# Patient Record
Sex: Male | Born: 1940 | Race: White | Hispanic: No | Marital: Married | Smoking: Former smoker
Health system: Southern US, Community
[De-identification: ages and names within clinical notes are randomized; demographics above are authoritative.]

## PROBLEM LIST (undated history)

## (undated) DIAGNOSIS — E785 Hyperlipidemia, unspecified: Secondary | ICD-10-CM

## (undated) DIAGNOSIS — E669 Obesity, unspecified: Secondary | ICD-10-CM

## (undated) DIAGNOSIS — I259 Chronic ischemic heart disease, unspecified: Secondary | ICD-10-CM

## (undated) DIAGNOSIS — I2510 Atherosclerotic heart disease of native coronary artery without angina pectoris: Secondary | ICD-10-CM

## (undated) DIAGNOSIS — I10 Essential (primary) hypertension: Secondary | ICD-10-CM

## (undated) DIAGNOSIS — I5189 Other ill-defined heart diseases: Secondary | ICD-10-CM

## (undated) HISTORY — DX: Essential (primary) hypertension: I10

## (undated) HISTORY — DX: Atherosclerotic heart disease of native coronary artery without angina pectoris: I25.10

## (undated) HISTORY — DX: Chronic ischemic heart disease, unspecified: I25.9

## (undated) HISTORY — DX: Other ill-defined heart diseases: I51.89

## (undated) HISTORY — DX: Hyperlipidemia, unspecified: E78.5

## (undated) HISTORY — PX: CORONARY ARTERY BYPASS GRAFT: SHX141

## (undated) HISTORY — PX: CHOLECYSTECTOMY: SHX55

## (undated) HISTORY — DX: Obesity, unspecified: E66.9

---

## 2006-12-06 ENCOUNTER — Ambulatory Visit: Payer: Self-pay | Admitting: Cardiology

## 2006-12-06 ENCOUNTER — Inpatient Hospital Stay (HOSPITAL_COMMUNITY): Admission: EM | Admit: 2006-12-06 | Discharge: 2006-12-10 | Payer: Self-pay | Admitting: Emergency Medicine

## 2007-01-09 ENCOUNTER — Ambulatory Visit: Payer: Self-pay | Admitting: Cardiology

## 2008-10-06 ENCOUNTER — Ambulatory Visit: Payer: Self-pay | Admitting: Cardiothoracic Surgery

## 2008-10-06 ENCOUNTER — Ambulatory Visit: Payer: Self-pay | Admitting: Diagnostic Radiology

## 2008-10-06 ENCOUNTER — Ambulatory Visit: Payer: Self-pay | Admitting: Internal Medicine

## 2008-10-06 ENCOUNTER — Inpatient Hospital Stay (HOSPITAL_COMMUNITY): Admission: AD | Admit: 2008-10-06 | Discharge: 2008-10-08 | Payer: Self-pay | Admitting: Internal Medicine

## 2008-10-06 ENCOUNTER — Encounter: Payer: Self-pay | Admitting: Emergency Medicine

## 2008-10-07 ENCOUNTER — Encounter: Payer: Self-pay | Admitting: Internal Medicine

## 2008-10-07 ENCOUNTER — Encounter: Payer: Self-pay | Admitting: Cardiothoracic Surgery

## 2008-11-24 ENCOUNTER — Ambulatory Visit: Payer: Self-pay | Admitting: Cardiothoracic Surgery

## 2008-11-24 ENCOUNTER — Encounter: Admission: RE | Admit: 2008-11-24 | Discharge: 2008-11-24 | Payer: Self-pay | Admitting: Cardiothoracic Surgery

## 2008-12-01 ENCOUNTER — Ambulatory Visit (HOSPITAL_COMMUNITY): Admission: RE | Admit: 2008-12-01 | Discharge: 2008-12-01 | Payer: Self-pay | Admitting: Cardiothoracic Surgery

## 2008-12-01 ENCOUNTER — Encounter: Payer: Self-pay | Admitting: Cardiothoracic Surgery

## 2008-12-06 ENCOUNTER — Ambulatory Visit (HOSPITAL_BASED_OUTPATIENT_CLINIC_OR_DEPARTMENT_OTHER): Admission: RE | Admit: 2008-12-06 | Discharge: 2008-12-06 | Payer: Self-pay | Admitting: Cardiothoracic Surgery

## 2008-12-06 ENCOUNTER — Ambulatory Visit: Payer: Self-pay | Admitting: Diagnostic Radiology

## 2008-12-07 ENCOUNTER — Inpatient Hospital Stay (HOSPITAL_COMMUNITY): Admission: RE | Admit: 2008-12-07 | Discharge: 2008-12-12 | Payer: Self-pay | Admitting: Cardiothoracic Surgery

## 2008-12-07 ENCOUNTER — Ambulatory Visit: Payer: Self-pay | Admitting: Cardiothoracic Surgery

## 2008-12-23 ENCOUNTER — Ambulatory Visit (HOSPITAL_BASED_OUTPATIENT_CLINIC_OR_DEPARTMENT_OTHER): Admission: RE | Admit: 2008-12-23 | Discharge: 2008-12-23 | Payer: Self-pay | Admitting: Cardiothoracic Surgery

## 2008-12-23 ENCOUNTER — Ambulatory Visit: Payer: Self-pay | Admitting: Diagnostic Radiology

## 2008-12-28 ENCOUNTER — Ambulatory Visit: Payer: Self-pay | Admitting: Cardiothoracic Surgery

## 2009-01-11 ENCOUNTER — Encounter: Admission: RE | Admit: 2009-01-11 | Discharge: 2009-01-11 | Payer: Self-pay | Admitting: Cardiothoracic Surgery

## 2009-01-11 ENCOUNTER — Ambulatory Visit: Payer: Self-pay | Admitting: Cardiothoracic Surgery

## 2009-07-20 IMAGING — CR DG CHEST 1V PORT
1 series · 1 of 1 positions shown · non-contrast
Comparison: 12/01/2008

CLINICAL DATA: CABG.  Endotracheal tube, nasogastric tube and chest
tube.

PORTABLE CHEST - 1 VIEW

[view not recorded]
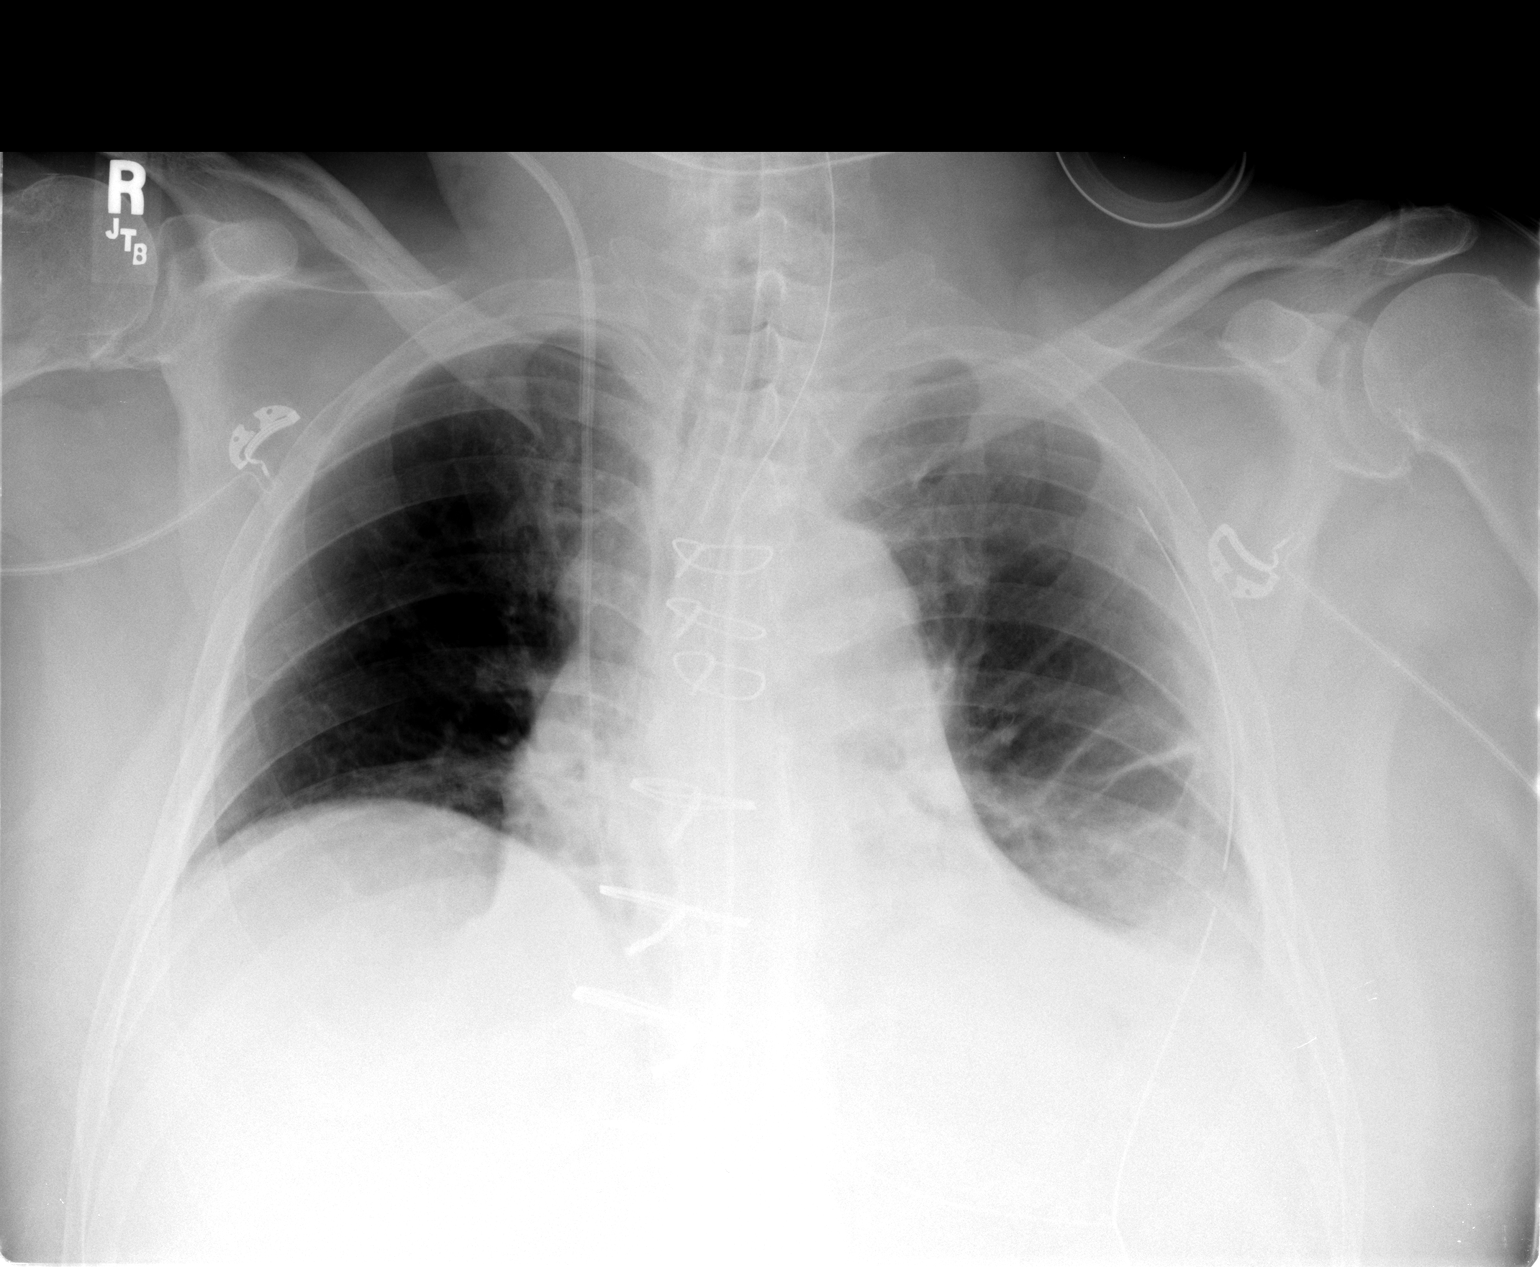

[1 of 1 positions shown; findings below may reference images not displayed]

FINDINGS: Endotracheal tube is in satisfactory position.
Nasogastric tube terminates in the stomach.  Right IJ Swan-Ganz
catheter tip projects over the main pulmonary artery.  Mediastinal
drain and left chest tube are in place.  There are seven intact
sternotomy wires.

Lungs are very low in volume with bibasilar air space disease, left
greater than right.  Cannot exclude left pleural effusion.  No
definite pneumothorax.
IMPRESSION: Low lung volumes with bibasilar air space disease, left greater
than right.  Cannot exclude left pleural effusion.

## 2009-07-22 IMAGING — CR DG CHEST 1V PORT
1 series · 1 of 1 positions shown · non-contrast
Comparison: Portable exam 8168 hours compared to 12/08/2008

CLINICAL DATA: Coronary artery disease status post CABG

PORTABLE CHEST - 1 VIEW

[AP]
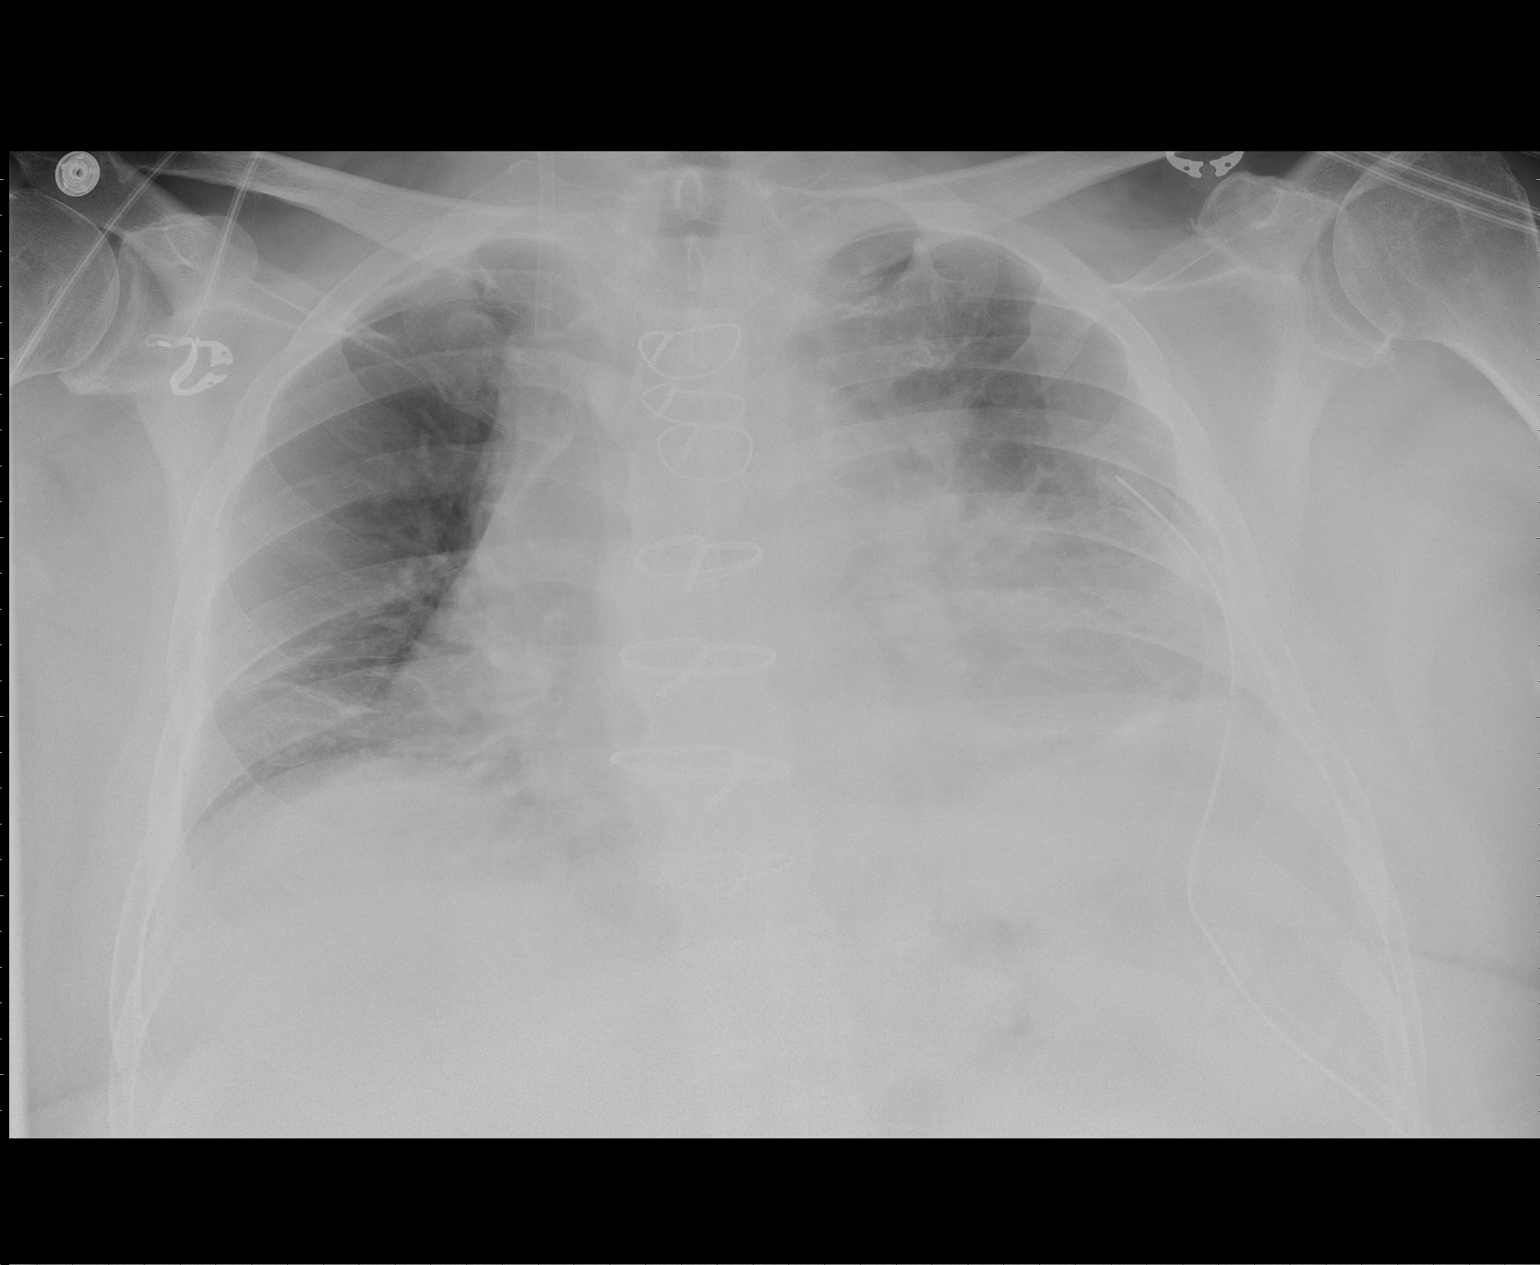

[1 of 1 positions shown; findings below may reference images not displayed]

FINDINGS: Swan-Ganz catheter and mediastinal drain removed.
Right jugular catheter and left thoracostomy tubes persist.
Cardiac enlargement status post CABG.
Decreased lung volumes with bibasilar atelectasis.
Very tiny left apex pneumothorax.
Bones unremarkable.
IMPRESSION: Cardiac enlargement status post CABG.
Bibasilar atelectasis.
Very tiny left apex pneumothorax in patient with left thoracostomy
tube.

## 2009-08-05 IMAGING — CR DG CHEST 2V
2 series · 2 of 2 positions shown · non-contrast
Comparison: 12/10/2008

CLINICAL DATA: Status post CABG

CHEST - 2 VIEW

[w chest pa]
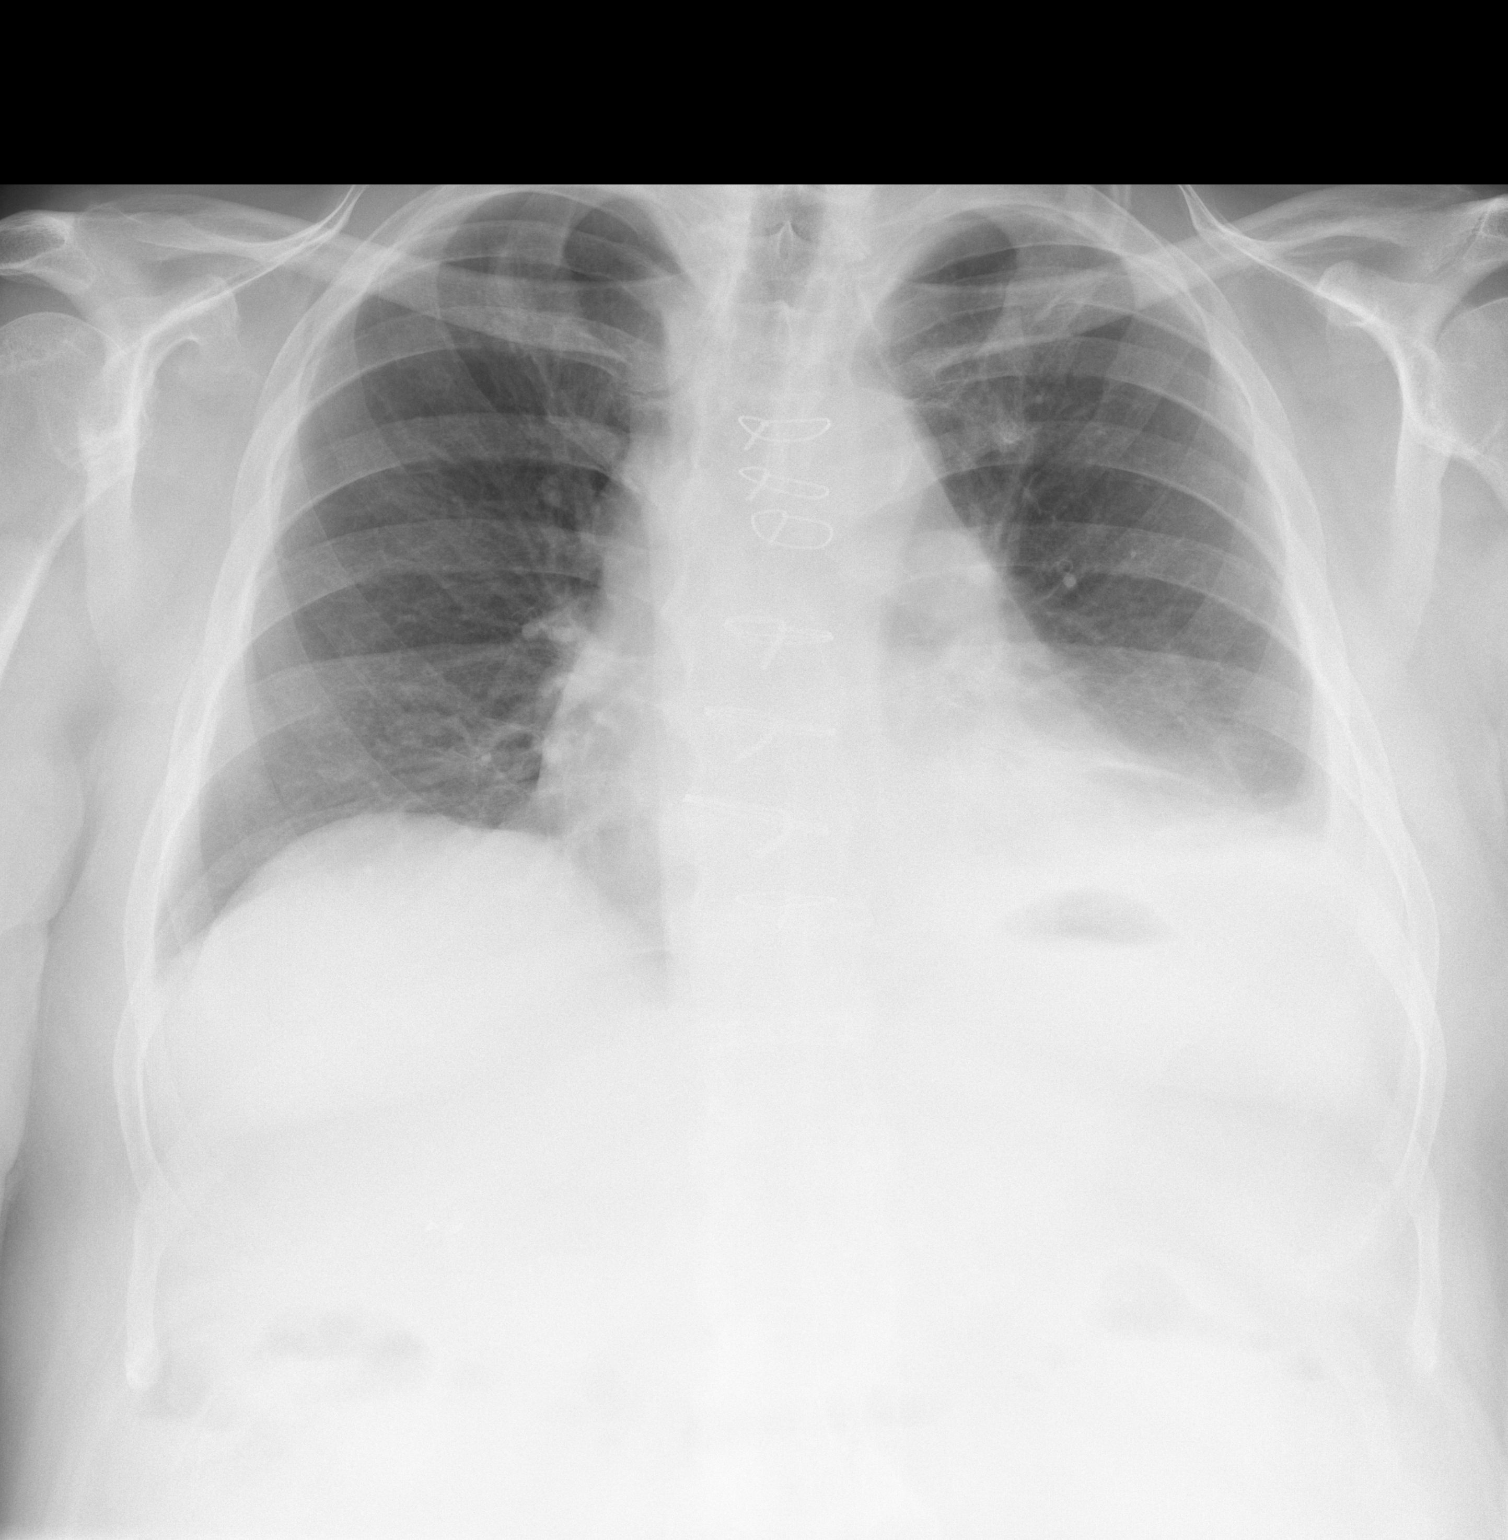

[w chest lat]
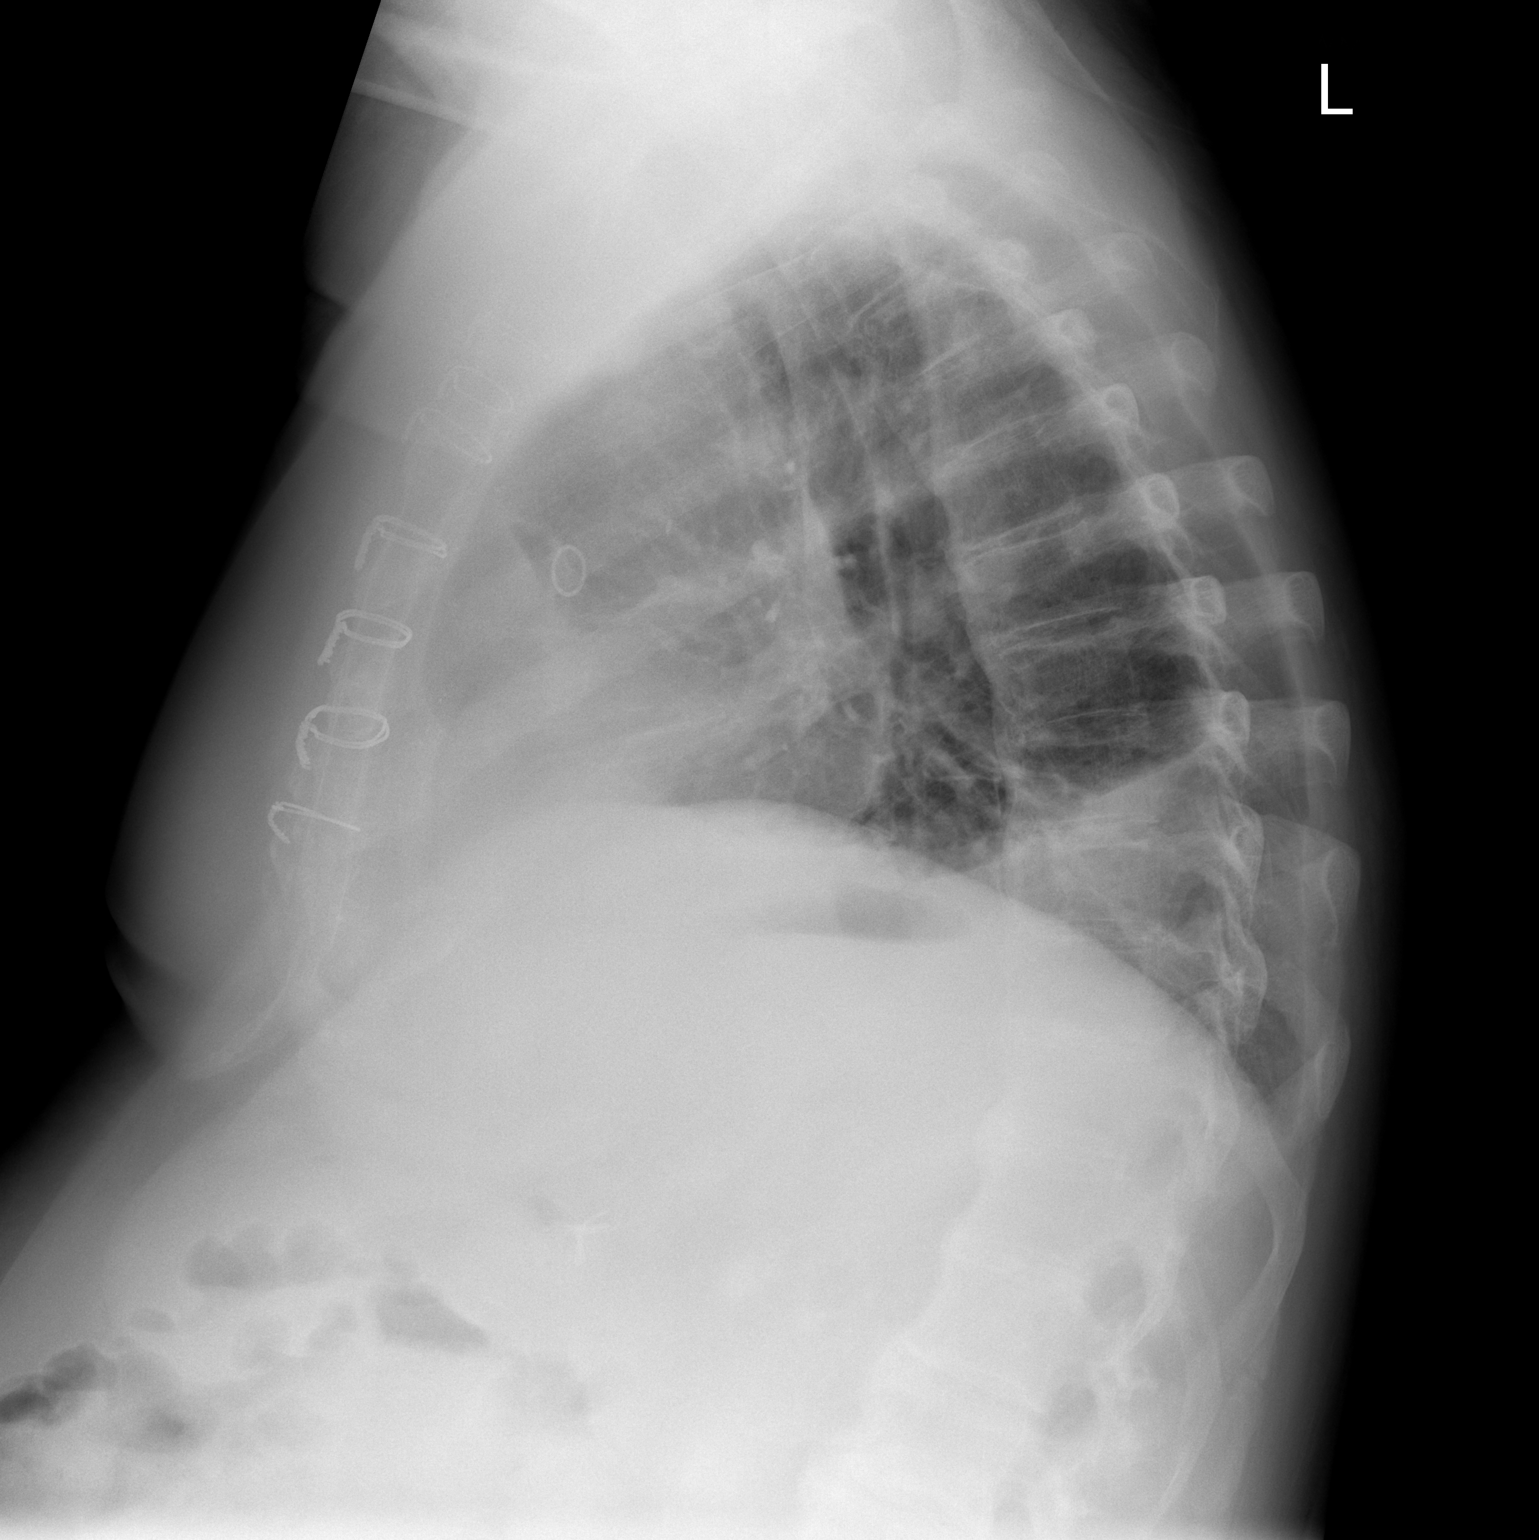

[2 of 2 positions shown; findings below may reference images not displayed]

FINDINGS: Cardiomediastinal silhouette is stable.  Status post CABG
again noted.  No acute infiltrate or edema.
There is small left pleural effusion with left basilar atelectasis.
Mild degenerative changes thoracic spine.
IMPRESSION: Status post CABG.  No acute infiltrate or edema.  Small left
pleural effusion with left basilar atelectasis.

## 2010-09-09 LAB — BASIC METABOLIC PANEL
BUN: 21 mg/dL (ref 6–23)
BUN: 24 mg/dL — ABNORMAL HIGH (ref 6–23)
BUN: 25 mg/dL — ABNORMAL HIGH (ref 6–23)
BUN: 28 mg/dL — ABNORMAL HIGH (ref 6–23)
CO2: 25 mEq/L (ref 19–32)
CO2: 27 mEq/L (ref 19–32)
CO2: 27 mEq/L (ref 19–32)
CO2: 29 mEq/L (ref 19–32)
Calcium: 7.5 mg/dL — ABNORMAL LOW (ref 8.4–10.5)
Calcium: 7.8 mg/dL — ABNORMAL LOW (ref 8.4–10.5)
Calcium: 8.3 mg/dL — ABNORMAL LOW (ref 8.4–10.5)
Calcium: 8.4 mg/dL (ref 8.4–10.5)
Chloride: 102 mEq/L (ref 96–112)
Chloride: 103 mEq/L (ref 96–112)
Chloride: 106 mEq/L (ref 96–112)
Chloride: 97 mEq/L (ref 96–112)
Creatinine, Ser: 1.05 mg/dL (ref 0.4–1.5)
Creatinine, Ser: 1.05 mg/dL (ref 0.4–1.5)
Creatinine, Ser: 1.13 mg/dL (ref 0.4–1.5)
Creatinine, Ser: 1.31 mg/dL (ref 0.4–1.5)
GFR calc Af Amer: >60 mL/min (ref >60)
GFR calc Af Amer: >60 mL/min (ref >60)
GFR calc Af Amer: >60 mL/min (ref >60)
GFR calc Af Amer: >60 mL/min (ref >60)
GFR calc non Af Amer: 55 mL/min — ABNORMAL LOW (ref >60)
GFR calc non Af Amer: >60 mL/min (ref >60)
GFR calc non Af Amer: >60 mL/min (ref >60)
GFR calc non Af Amer: >60 mL/min (ref >60)
Glucose, Bld: 130 mg/dL — ABNORMAL HIGH (ref 70–99)
Glucose, Bld: 193 mg/dL — ABNORMAL HIGH (ref 70–99)
Glucose, Bld: 48 mg/dL — ABNORMAL LOW (ref 70–99)
Glucose, Bld: 91 mg/dL (ref 70–99)
Potassium: 3.1 mEq/L — ABNORMAL LOW (ref 3.5–5.1)
Potassium: 3.8 mEq/L (ref 3.5–5.1)
Potassium: 4 mEq/L (ref 3.5–5.1)
Potassium: 4.3 mEq/L (ref 3.5–5.1)
Sodium: 129 mEq/L — ABNORMAL LOW (ref 135–145)
Sodium: 137 mEq/L (ref 135–145)
Sodium: 137 mEq/L (ref 135–145)
Sodium: 138 mEq/L (ref 135–145)

## 2010-09-09 LAB — POCT I-STAT 3, ART BLOOD GAS (G3+)
Acid-Base Excess: 1 mmol/L (ref 0.0–2.0)
Acid-base deficit: 1 mmol/L (ref 0.0–2.0)
Bicarbonate: 23.3 mEq/L (ref 20.0–24.0)
Bicarbonate: 24.8 mEq/L — ABNORMAL HIGH (ref 20.0–24.0)
Bicarbonate: 26.3 mEq/L — ABNORMAL HIGH (ref 20.0–24.0)
O2 Saturation: 100 %
O2 Saturation: 95 %
O2 Saturation: 99 %
Patient temperature: 35.6
Patient temperature: 37.2
TCO2: 24 mmol/L (ref 0–100)
TCO2: 26 mmol/L (ref 0–100)
TCO2: 28 mmol/L (ref 0–100)
pCO2 arterial: 32.6 mmHg — ABNORMAL LOW (ref 35.0–45.0)
pCO2 arterial: 41.2 mmHg (ref 35.0–45.0)
pCO2 arterial: 42.6 mmHg (ref 35.0–45.0)
pH, Arterial: 7.389 (ref 7.350–7.450)
pH, Arterial: 7.399 (ref 7.350–7.450)
pH, Arterial: 7.456 — ABNORMAL HIGH (ref 7.350–7.450)
pO2, Arterial: 120 mmHg — ABNORMAL HIGH (ref 80.0–100.0)
pO2, Arterial: 271 mmHg — ABNORMAL HIGH (ref 80.0–100.0)
pO2, Arterial: 66 mmHg — ABNORMAL LOW (ref 80.0–100.0)

## 2010-09-09 LAB — BLOOD GAS, ARTERIAL
Acid-Base Excess: 0.9 mmol/L (ref 0.0–2.0)
Bicarbonate: 24.2 mEq/L — ABNORMAL HIGH (ref 20.0–24.0)
Drawn by: 206361
FIO2: 0.21 %
O2 Saturation: 98.3 %
Patient temperature: 98.6
TCO2: 25.3 mmol/L (ref 0–100)
pCO2 arterial: 33.8 mmHg — ABNORMAL LOW (ref 35.0–45.0)
pH, Arterial: 7.469 — ABNORMAL HIGH (ref 7.350–7.450)
pO2, Arterial: 95 mmHg (ref 80.0–100.0)

## 2010-09-09 LAB — CBC
HCT: 30.1 % — ABNORMAL LOW (ref 39.0–52.0)
HCT: 30.6 % — ABNORMAL LOW (ref 39.0–52.0)
HCT: 31.3 % — ABNORMAL LOW (ref 39.0–52.0)
HCT: 32 % — ABNORMAL LOW (ref 39.0–52.0)
HCT: 32.4 % — ABNORMAL LOW (ref 39.0–52.0)
HCT: 33.2 % — ABNORMAL LOW (ref 39.0–52.0)
HCT: 33.2 % — ABNORMAL LOW (ref 39.0–52.0)
HCT: 40.5 % (ref 39.0–52.0)
Hemoglobin: 10.4 g/dL — ABNORMAL LOW (ref 13.0–17.0)
Hemoglobin: 10.6 g/dL — ABNORMAL LOW (ref 13.0–17.0)
Hemoglobin: 10.8 g/dL — ABNORMAL LOW (ref 13.0–17.0)
Hemoglobin: 11.1 g/dL — ABNORMAL LOW (ref 13.0–17.0)
Hemoglobin: 11.2 g/dL — ABNORMAL LOW (ref 13.0–17.0)
Hemoglobin: 11.3 g/dL — ABNORMAL LOW (ref 13.0–17.0)
Hemoglobin: 11.6 g/dL — ABNORMAL LOW (ref 13.0–17.0)
Hemoglobin: 14.2 g/dL (ref 13.0–17.0)
MCHC: 33.7 g/dL (ref 30.0–36.0)
MCHC: 34.4 g/dL (ref 30.0–36.0)
MCHC: 34.6 g/dL (ref 30.0–36.0)
MCHC: 34.6 g/dL (ref 30.0–36.0)
MCHC: 34.8 g/dL (ref 30.0–36.0)
MCHC: 34.8 g/dL (ref 30.0–36.0)
MCHC: 34.8 g/dL (ref 30.0–36.0)
MCHC: 35.1 g/dL (ref 30.0–36.0)
MCV: 84.5 fL (ref 78.0–100.0)
MCV: 84.6 fL (ref 78.0–100.0)
MCV: 85.1 fL (ref 78.0–100.0)
MCV: 85.2 fL (ref 78.0–100.0)
MCV: 85.4 fL (ref 78.0–100.0)
MCV: 85.4 fL (ref 78.0–100.0)
MCV: 85.5 fL (ref 78.0–100.0)
MCV: 85.7 fL (ref 78.0–100.0)
Platelets: 140 10*3/uL — ABNORMAL LOW (ref 150–400)
Platelets: 142 10*3/uL — ABNORMAL LOW (ref 150–400)
Platelets: 142 10*3/uL — ABNORMAL LOW (ref 150–400)
Platelets: 147 10*3/uL — ABNORMAL LOW (ref 150–400)
Platelets: 150 10*3/uL (ref 150–400)
Platelets: 165 10*3/uL (ref 150–400)
Platelets: 213 10*3/uL (ref 150–400)
Platelets: 234 10*3/uL (ref 150–400)
RBC: 3.54 MIL/uL — ABNORMAL LOW (ref 4.22–5.81)
RBC: 3.59 MIL/uL — ABNORMAL LOW (ref 4.22–5.81)
RBC: 3.66 MIL/uL — ABNORMAL LOW (ref 4.22–5.81)
RBC: 3.74 MIL/uL — ABNORMAL LOW (ref 4.22–5.81)
RBC: 3.78 MIL/uL — ABNORMAL LOW (ref 4.22–5.81)
RBC: 3.9 MIL/uL — ABNORMAL LOW (ref 4.22–5.81)
RBC: 3.93 MIL/uL — ABNORMAL LOW (ref 4.22–5.81)
RBC: 4.79 MIL/uL (ref 4.22–5.81)
RDW: 13.3 % (ref 11.5–15.5)
RDW: 13.5 % (ref 11.5–15.5)
RDW: 13.5 % (ref 11.5–15.5)
RDW: 13.7 % (ref 11.5–15.5)
RDW: 13.8 % (ref 11.5–15.5)
RDW: 13.8 % (ref 11.5–15.5)
RDW: 13.8 % (ref 11.5–15.5)
RDW: 14.1 % (ref 11.5–15.5)
WBC: 10.3 10*3/uL (ref 4.0–10.5)
WBC: 12.6 10*3/uL — ABNORMAL HIGH (ref 4.0–10.5)
WBC: 14.6 10*3/uL — ABNORMAL HIGH (ref 4.0–10.5)
WBC: 15.7 10*3/uL — ABNORMAL HIGH (ref 4.0–10.5)
WBC: 16.2 10*3/uL — ABNORMAL HIGH (ref 4.0–10.5)
WBC: 17.3 10*3/uL — ABNORMAL HIGH (ref 4.0–10.5)
WBC: 8.3 10*3/uL (ref 4.0–10.5)
WBC: 8.4 10*3/uL (ref 4.0–10.5)

## 2010-09-09 LAB — POCT I-STAT 4, (NA,K, GLUC, HGB,HCT)
Glucose, Bld: 153 mg/dL — ABNORMAL HIGH (ref 70–99)
Glucose, Bld: 163 mg/dL — ABNORMAL HIGH (ref 70–99)
Glucose, Bld: 177 mg/dL — ABNORMAL HIGH (ref 70–99)
Glucose, Bld: 183 mg/dL — ABNORMAL HIGH (ref 70–99)
Glucose, Bld: 197 mg/dL — ABNORMAL HIGH (ref 70–99)
Glucose, Bld: 220 mg/dL — ABNORMAL HIGH (ref 70–99)
HCT: 24 % — ABNORMAL LOW (ref 39.0–52.0)
HCT: 24 % — ABNORMAL LOW (ref 39.0–52.0)
HCT: 25 % — ABNORMAL LOW (ref 39.0–52.0)
HCT: 32 % — ABNORMAL LOW (ref 39.0–52.0)
HCT: 35 % — ABNORMAL LOW (ref 39.0–52.0)
HCT: 41 % (ref 39.0–52.0)
Hemoglobin: 10.9 g/dL — ABNORMAL LOW (ref 13.0–17.0)
Hemoglobin: 11.9 g/dL — ABNORMAL LOW (ref 13.0–17.0)
Hemoglobin: 13.9 g/dL (ref 13.0–17.0)
Hemoglobin: 8.2 g/dL — ABNORMAL LOW (ref 13.0–17.0)
Hemoglobin: 8.2 g/dL — ABNORMAL LOW (ref 13.0–17.0)
Hemoglobin: 8.5 g/dL — ABNORMAL LOW (ref 13.0–17.0)
Potassium: 3.1 mEq/L — ABNORMAL LOW (ref 3.5–5.1)
Potassium: 3.4 mEq/L — ABNORMAL LOW (ref 3.5–5.1)
Potassium: 3.5 mEq/L (ref 3.5–5.1)
Potassium: 3.6 mEq/L (ref 3.5–5.1)
Potassium: 3.8 mEq/L (ref 3.5–5.1)
Potassium: 3.9 mEq/L (ref 3.5–5.1)
Sodium: 134 mEq/L — ABNORMAL LOW (ref 135–145)
Sodium: 135 mEq/L (ref 135–145)
Sodium: 135 mEq/L (ref 135–145)
Sodium: 135 mEq/L (ref 135–145)
Sodium: 136 mEq/L (ref 135–145)
Sodium: 137 mEq/L (ref 135–145)

## 2010-09-09 LAB — COMPREHENSIVE METABOLIC PANEL
ALT: 20 U/L (ref 0–53)
AST: 19 U/L (ref 0–37)
Albumin: 4.2 g/dL (ref 3.5–5.2)
Alkaline Phosphatase: 55 U/L (ref 39–117)
BUN: 22 mg/dL (ref 6–23)
CO2: 21 mEq/L (ref 19–32)
Calcium: 9.9 mg/dL (ref 8.4–10.5)
Chloride: 105 mEq/L (ref 96–112)
Creatinine, Ser: 0.85 mg/dL (ref 0.4–1.5)
GFR calc Af Amer: >60 mL/min (ref >60)
GFR calc non Af Amer: >60 mL/min (ref >60)
Glucose, Bld: 158 mg/dL — ABNORMAL HIGH (ref 70–99)
Potassium: 4.5 mEq/L (ref 3.5–5.1)
Sodium: 135 mEq/L (ref 135–145)
Total Bilirubin: 0.8 mg/dL (ref 0.3–1.2)
Total Protein: 6.9 g/dL (ref 6.0–8.3)

## 2010-09-09 LAB — GLUCOSE, CAPILLARY
Glucose-Capillary: 101 mg/dL — ABNORMAL HIGH (ref 70–99)
Glucose-Capillary: 114 mg/dL — ABNORMAL HIGH (ref 70–99)
Glucose-Capillary: 116 mg/dL — ABNORMAL HIGH (ref 70–99)
Glucose-Capillary: 119 mg/dL — ABNORMAL HIGH (ref 70–99)
Glucose-Capillary: 134 mg/dL — ABNORMAL HIGH (ref 70–99)
Glucose-Capillary: 136 mg/dL — ABNORMAL HIGH (ref 70–99)
Glucose-Capillary: 136 mg/dL — ABNORMAL HIGH (ref 70–99)
Glucose-Capillary: 138 mg/dL — ABNORMAL HIGH (ref 70–99)
Glucose-Capillary: 140 mg/dL — ABNORMAL HIGH (ref 70–99)
Glucose-Capillary: 157 mg/dL — ABNORMAL HIGH (ref 70–99)
Glucose-Capillary: 168 mg/dL — ABNORMAL HIGH (ref 70–99)
Glucose-Capillary: 172 mg/dL — ABNORMAL HIGH (ref 70–99)
Glucose-Capillary: 175 mg/dL — ABNORMAL HIGH (ref 70–99)
Glucose-Capillary: 175 mg/dL — ABNORMAL HIGH (ref 70–99)
Glucose-Capillary: 177 mg/dL — ABNORMAL HIGH (ref 70–99)
Glucose-Capillary: 179 mg/dL — ABNORMAL HIGH (ref 70–99)
Glucose-Capillary: 185 mg/dL — ABNORMAL HIGH (ref 70–99)
Glucose-Capillary: 186 mg/dL — ABNORMAL HIGH (ref 70–99)
Glucose-Capillary: 193 mg/dL — ABNORMAL HIGH (ref 70–99)
Glucose-Capillary: 197 mg/dL — ABNORMAL HIGH (ref 70–99)
Glucose-Capillary: 210 mg/dL — ABNORMAL HIGH (ref 70–99)
Glucose-Capillary: 218 mg/dL — ABNORMAL HIGH (ref 70–99)
Glucose-Capillary: 227 mg/dL — ABNORMAL HIGH (ref 70–99)
Glucose-Capillary: 272 mg/dL — ABNORMAL HIGH (ref 70–99)
Glucose-Capillary: 50 mg/dL — ABNORMAL LOW (ref 70–99)
Glucose-Capillary: 50 mg/dL — ABNORMAL LOW (ref 70–99)
Glucose-Capillary: 53 mg/dL — ABNORMAL LOW (ref 70–99)
Glucose-Capillary: 65 mg/dL — ABNORMAL LOW (ref 70–99)
Glucose-Capillary: 67 mg/dL — ABNORMAL LOW (ref 70–99)
Glucose-Capillary: 71 mg/dL (ref 70–99)
Glucose-Capillary: 73 mg/dL (ref 70–99)
Glucose-Capillary: 74 mg/dL (ref 70–99)
Glucose-Capillary: 75 mg/dL (ref 70–99)
Glucose-Capillary: 84 mg/dL (ref 70–99)
Glucose-Capillary: 87 mg/dL (ref 70–99)
Glucose-Capillary: 89 mg/dL (ref 70–99)
Glucose-Capillary: 91 mg/dL (ref 70–99)
Glucose-Capillary: 92 mg/dL (ref 70–99)
Glucose-Capillary: 95 mg/dL (ref 70–99)
Glucose-Capillary: 95 mg/dL (ref 70–99)
Glucose-Capillary: 98 mg/dL (ref 70–99)
Glucose-Capillary: 99 mg/dL (ref 70–99)

## 2010-09-09 LAB — APTT
aPTT: 26 seconds (ref 24–37)
aPTT: 27 seconds (ref 24–37)

## 2010-09-09 LAB — MAGNESIUM
Magnesium: 2.1 mg/dL (ref 1.5–2.5)
Magnesium: 2.1 mg/dL (ref 1.5–2.5)
Magnesium: 2.2 mg/dL (ref 1.5–2.5)

## 2010-09-09 LAB — URINALYSIS, ROUTINE W REFLEX MICROSCOPIC
Bilirubin Urine: NEGATIVE
Glucose, UA: NEGATIVE mg/dL
Hgb urine dipstick: NEGATIVE
Ketones, ur: NEGATIVE mg/dL
Nitrite: NEGATIVE
Protein, ur: NEGATIVE mg/dL
Specific Gravity, Urine: 1.02 (ref 1.005–1.030)
Urobilinogen, UA: 1 mg/dL (ref 0.0–1.0)
pH: 7 (ref 5.0–8.0)

## 2010-09-09 LAB — CK TOTAL AND CKMB (NOT AT ARMC)
CK, MB: 10 ng/mL — ABNORMAL HIGH (ref 0.3–4.0)
Relative Index: 4.2 — ABNORMAL HIGH (ref 0.0–2.5)
Total CK: 239 U/L — ABNORMAL HIGH (ref 7–232)

## 2010-09-09 LAB — POCT I-STAT, CHEM 8
BUN: 25 mg/dL — ABNORMAL HIGH (ref 6–23)
BUN: 27 mg/dL — ABNORMAL HIGH (ref 6–23)
Calcium, Ion: 1.06 mmol/L — ABNORMAL LOW (ref 1.12–1.32)
Calcium, Ion: 1.16 mmol/L (ref 1.12–1.32)
Chloride: 103 mEq/L (ref 96–112)
Chloride: 97 mEq/L (ref 96–112)
Creatinine, Ser: 1.1 mg/dL (ref 0.4–1.5)
Creatinine, Ser: 1.1 mg/dL (ref 0.4–1.5)
Glucose, Bld: 173 mg/dL — ABNORMAL HIGH (ref 70–99)
Glucose, Bld: 251 mg/dL — ABNORMAL HIGH (ref 70–99)
HCT: 32 % — ABNORMAL LOW (ref 39.0–52.0)
HCT: 34 % — ABNORMAL LOW (ref 39.0–52.0)
Hemoglobin: 10.9 g/dL — ABNORMAL LOW (ref 13.0–17.0)
Hemoglobin: 11.6 g/dL — ABNORMAL LOW (ref 13.0–17.0)
Potassium: 3.8 mEq/L (ref 3.5–5.1)
Potassium: 4.6 mEq/L (ref 3.5–5.1)
Sodium: 133 mEq/L — ABNORMAL LOW (ref 135–145)
Sodium: 138 mEq/L (ref 135–145)
TCO2: 22 mmol/L (ref 0–100)
TCO2: 25 mmol/L (ref 0–100)

## 2010-09-09 LAB — PROTIME-INR
INR: 1 (ref 0.00–1.49)
INR: 1.2 (ref 0.00–1.49)
Prothrombin Time: 12.8 seconds (ref 11.6–15.2)
Prothrombin Time: 15.2 seconds (ref 11.6–15.2)

## 2010-09-09 LAB — CREATININE, SERUM
Creatinine, Ser: 1.16 mg/dL (ref 0.4–1.5)
Creatinine, Ser: 1.27 mg/dL (ref 0.4–1.5)
GFR calc Af Amer: >60 mL/min (ref >60)
GFR calc Af Amer: >60 mL/min (ref >60)
GFR calc non Af Amer: 57 mL/min — ABNORMAL LOW (ref >60)
GFR calc non Af Amer: >60 mL/min (ref >60)

## 2010-09-09 LAB — HEMOGLOBIN A1C
Hgb A1c MFr Bld: 9.8 % — ABNORMAL HIGH (ref 4.6–6.1)
Mean Plasma Glucose: 235 mg/dL

## 2010-09-09 LAB — TYPE AND SCREEN
ABO/RH(D): B POS
Antibody Screen: NEGATIVE

## 2010-09-09 LAB — PLATELET COUNT: Platelets: 141 10*3/uL — ABNORMAL LOW (ref 150–400)

## 2010-09-09 LAB — HEMOGLOBIN AND HEMATOCRIT, BLOOD
HCT: 22.6 % — ABNORMAL LOW (ref 39.0–52.0)
Hemoglobin: 8 g/dL — ABNORMAL LOW (ref 13.0–17.0)

## 2010-09-09 LAB — ABO/RH: ABO/RH(D): B POS

## 2010-09-09 LAB — MRSA PCR SCREENING: MRSA by PCR: NEGATIVE

## 2010-09-09 LAB — POCT I-STAT GLUCOSE
Glucose, Bld: 194 mg/dL — ABNORMAL HIGH (ref 70–99)
Operator id: 173791

## 2010-09-11 LAB — PROTIME-INR
INR: 1 (ref 0.00–1.49)
Prothrombin Time: 13.2 seconds (ref 11.6–15.2)

## 2010-09-11 LAB — POCT CARDIAC MARKERS
Myoglobin, poc: 160 ng/mL (ref 12–200)
Troponin i, poc: 0.06 ng/mL (ref 0.00–0.09)

## 2010-09-11 LAB — CARDIAC PANEL(CRET KIN+CKTOT+MB+TROPI)
CK, MB: 3.3 ng/mL (ref 0.3–4.0)
Relative Index: INVALID (ref 0.0–2.5)
Relative Index: INVALID (ref 0.0–2.5)
Total CK: 75 U/L (ref 7–232)
Total CK: 87 U/L (ref 7–232)
Troponin I: 0.04 ng/mL (ref 0.00–0.06)
Troponin I: 0.05 ng/mL (ref 0.00–0.06)

## 2010-09-11 LAB — CBC
HCT: 35.3 % — ABNORMAL LOW (ref 39.0–52.0)
HCT: 37.1 % — ABNORMAL LOW (ref 39.0–52.0)
HCT: 37.2 % — ABNORMAL LOW (ref 39.0–52.0)
Hemoglobin: 12.1 g/dL — ABNORMAL LOW (ref 13.0–17.0)
Hemoglobin: 12.8 g/dL — ABNORMAL LOW (ref 13.0–17.0)
MCHC: 34.2 g/dL (ref 30.0–36.0)
MCV: 86.1 fL (ref 78.0–100.0)
MCV: 86.2 fL (ref 78.0–100.0)
Platelets: 174 10*3/uL (ref 150–400)
RBC: 4.1 MIL/uL — ABNORMAL LOW (ref 4.22–5.81)
RBC: 4.32 MIL/uL (ref 4.22–5.81)
RDW: 13.2 % (ref 11.5–15.5)
RDW: 12.4 % (ref 11.5–15.5)
WBC: 5.8 10*3/uL (ref 4.0–10.5)
WBC: 6.6 10*3/uL (ref 4.0–10.5)
WBC: 7.2 10*3/uL (ref 4.0–10.5)

## 2010-09-11 LAB — HEPATIC FUNCTION PANEL
ALT: 17 U/L (ref 0–53)
Bilirubin, Direct: 0.1 mg/dL (ref 0.0–0.3)
Indirect Bilirubin: 0.5 mg/dL (ref 0.3–0.9)
Total Protein: 6.1 g/dL (ref 6.0–8.3)

## 2010-09-11 LAB — BASIC METABOLIC PANEL
CO2: 26 mEq/L (ref 19–32)
CO2: 28 mEq/L (ref 19–32)
Calcium: 8.9 mg/dL (ref 8.4–10.5)
Calcium: 9.4 mg/dL (ref 8.4–10.5)
Chloride: 106 mEq/L (ref 96–112)
Chloride: 107 mEq/L (ref 96–112)
Creatinine, Ser: 0.84 mg/dL (ref 0.4–1.5)
GFR calc Af Amer: >60 mL/min (ref >60)
GFR calc Af Amer: >60 mL/min (ref >60)
GFR calc Af Amer: >60 mL/min (ref >60)
GFR calc non Af Amer: >60 mL/min (ref >60)
Potassium: 3.9 mEq/L (ref 3.5–5.1)
Potassium: 4.4 mEq/L (ref 3.5–5.1)
Sodium: 141 mEq/L (ref 135–145)
Sodium: 142 mEq/L (ref 135–145)

## 2010-09-11 LAB — GLUCOSE, CAPILLARY
Glucose-Capillary: 102 mg/dL — ABNORMAL HIGH (ref 70–99)
Glucose-Capillary: 111 mg/dL — ABNORMAL HIGH (ref 70–99)
Glucose-Capillary: 121 mg/dL — ABNORMAL HIGH (ref 70–99)
Glucose-Capillary: 212 mg/dL — ABNORMAL HIGH (ref 70–99)
Glucose-Capillary: 249 mg/dL — ABNORMAL HIGH (ref 70–99)

## 2010-09-11 LAB — URINALYSIS, ROUTINE W REFLEX MICROSCOPIC
Glucose, UA: >1000 mg/dL — AB
Ketones, ur: 15 mg/dL — AB
Leukocytes, UA: NEGATIVE
Nitrite: NEGATIVE
Protein, ur: 30 mg/dL — AB
pH: 5.5 (ref 5.0–8.0)

## 2010-09-11 LAB — HEPARIN LEVEL (UNFRACTIONATED)
Heparin Unfractionated: 0.37 IU/mL (ref 0.30–0.70)
Heparin Unfractionated: <0.1 IU/mL — ABNORMAL LOW (ref 0.30–0.70)

## 2010-09-11 LAB — DIFFERENTIAL
Basophils Absolute: 0.1 10*3/uL (ref 0.0–0.1)
Eosinophils Relative: 2 % (ref 0–5)
Lymphocytes Relative: 18 % (ref 12–46)
Lymphs Abs: 1.3 10*3/uL (ref 0.7–4.0)
Monocytes Absolute: 0.8 10*3/uL (ref 0.1–1.0)
Neutro Abs: 4.9 10*3/uL (ref 1.7–7.7)

## 2010-09-11 LAB — TSH: TSH: 1.523 u[IU]/mL (ref 0.350–4.500)

## 2010-09-11 LAB — POCT B-TYPE NATRIURETIC PEPTIDE (BNP): B Natriuretic Peptide, POC: 47.1 pg/mL (ref 0–100)

## 2010-09-11 LAB — MAGNESIUM: Magnesium: 2 mg/dL (ref 1.5–2.5)

## 2010-09-11 LAB — LIPID PANEL
HDL: 41 mg/dL (ref >39)
VLDL: 24 mg/dL (ref 0–40)

## 2010-09-11 LAB — URINE MICROSCOPIC-ADD ON

## 2010-09-11 LAB — APTT: aPTT: 27 seconds (ref 24–37)

## 2010-10-16 NOTE — Assessment & Plan Note (Signed)
[facility]                            CARDIOLOGY OFFICE NOTE   [name], [name]                        MRN:          [id number]  DATE:01/09/2007                            DOB:          1941/01/10    Mr. [name] is seen today in followup.  He has been kindly sent by Dr.  Patty Sermons for followup.  This very nice gentleman presented as a code  STEMI to [facility] on December 06, 2006.  He was taken acutely to  the catheterization laboratory.  The patient had a ruptured plaque in  the left anterior descending artery, and this was successfully dilated.  It was adjacent to a diagonal and we elected not to stent the vessel.  There were also distal in-stent restenoses, and these were redilated as  well.  The final angiographic result was relatively good and we used a  cutting balloon with reduction of the stenoses from 95% down to 30% to  40%.  At the conclusion of the procedure, there was excellent TIMI-3  flow.  The patient has underlying diabetes.  He has had multiple stent  procedures.  He has had 2 stents placed for an acute MI in 2002 and  another stent placed in 2003 and in May 2007, had more stents placed.  More recently, these have been done at the [facility] and he had been on  Plavix for a year, but that was stopped in May 2008 after a year of  Plavix.  Since discharge from the hospital, he has been doing relatively  well.  His CK-MB was 257 and a total CK was 2051.  I had recommended and  considered that the patient should have repeat cardiac catheterization  at about 8 weeks.  It was my feeling that the incidence of target vessel  revascularization was extraordinarily high, that the patient might not  have significant symptoms, and with diabetes, his target vessel  revascularization rate, especially without stenting, would be  significant.  Whether or not alternative options should be considered  would be important, and it is our belief that  currently the continued  patency, especially in diabetics, is important with regard to long-term  outcome; this has been well documented in the literature, and also been  discussed at length.  Fortunately, the estimated ejection fraction at  the time of his catheterization was around 50%.   CURRENT MEDICATIONS:  1. Aspirin 325 mg daily.  2. Plavix 75 mg daily.  3. Lisinopril 10 mg b.i.d.  4. Simvastatin 80 mg nightly.  5. Prozac 20 mg daily.  6. Glyburide 10 mg b.i.d.  7. Toprol-XL 50 mg daily.  8. Metformin 850 mg b.i.d.  9. Fish oil b.i.d.  10.Insulin.   PHYSICAL EXAMINATION:  The weight is 244 pounds, the blood pressure is  120/60 in the right arm and 118/60 in the left arm.  The lung fields are relatively clear to auscultation and percussion.  The PMI is nondisplaced and the cardiac rhythm is regular with a minimal  systolic ejection murmur.   EKG:  Reveals normal sinus rhythm.  There is evidence of an age-  indeterminate inferior infarct and anterolateral T-wave inversion  compatible with anterior infarct of indeterminate age as well.  There  are no current significant ST changes and there are recovering T waves.   IMPRESSION:  1. Insulin-dependent diabetes mellitus.  2. Extensive coronary artery disease with previous stenting and recent      acute myocardial infarction treated with angioplasty.   DISPOSITION:  The patient will continue to see Dr. Patty Sermons in  followup.  At the present time, I think it probably would be wise to  consider a repeat cardiac catheterization at about 8 weeks.  Importantly, he has a very high risk of target vessel revascularization,  and while we could wait for symptoms, he had 3 lesions dilated including  2 in-stent restenoses, and a ruptured plaque overlying a diagonal.  His  anterior wall was not akinetic.  My hope would be that he would have  some recovery of the anterior wall and, in fact, be a candidate if it  was necessary for  consideration of alternative strategies; this could  include a possibility of drug-eluting stent implantation and/or  revascularization surgery.  I think his long-term prognosis is  important, relative to continued patency of the infarct-related  arteries.  The patient is considering this and he will get back to [place].     Arturo Morton. Riley Kill, MD, [facility]  Electronically Signed    TDS/MedQ  DD: 01/28/2007  DT: 01/28/2007  Job #: [id number]   cc:   Cassell Clement, M.D.

## 2010-10-16 NOTE — Discharge Summary (Signed)
[name], MECKES               ACCOUNT NO.:  [id number]   MEDICAL RECORD NO.:  [id number]          PATIENT TYPE:  INP   LOCATION:  2020                         FACILITY:  [facility]   PHYSICIAN:  Cassell Clement, M.D. DATE OF BIRTH:  [birth year]   DATE OF ADMISSION:  12/06/2006  DATE OF DISCHARGE:  12/10/2006                               DISCHARGE SUMMARY   FINAL DIAGNOSES:  1. Ischemic heart disease with acute anterior wall myocardial      infarction secondary to mid 99% ruptured plaque in the LAD over the      diagonal.  2. Coronary atherosclerosis with history of prior stents in 2002,      2003, and 2007.  3. Diabetes mellitus with hemoglobin A1c of 9.9.  4. Dyslipidemia.  5. Hypertension.  6. Exogenous obesity.   OPERATIONS PERFORMED:  The patient was a code STEMI and taken acutely to  the lab by Dr. Bonnee Quin who performed a PTCA of the ruptured plaque  in the mid LAD.  He did not put a stent in the lesion because it was  adjacent to the diagonal, and there was distal in-stent restenosis in  two locations distally to the ruptured plaque.  The patient also had  treatment of the distal 95% in-stent restenosis with the cutting balloon  and balloon with reduction of the stenosis from 95% to 30-40%.  He also  had a balloon of the distal 95% in-stent restenosis with reduction to 30-  40%, and there was good TIMI III flow at the conclusion of the  procedure.   HISTORY OF PRESENT ILLNESS:  This 70 year old married Caucasian  gentleman admitted from the emergency room with substernal chest pain.  The pain occurred while the patient was at work earlier today.  He  initially called his wife to come get him and then changed his mind, and  his coworkers called  [phone].  He was brought by emergency ambulance.  In  the emergency room his first two sets of enzymes were negative.  Third  set was positive, and the patient had ongoing chest pain with new EKG  changes present on the second EKG  not present on the first.  He had  nausea, vomiting, and some tingling in the left arm.  He has a strong  past history of coronary atherosclerosis with previous two stents placed  in 2002 for an acute MI, and in March 2003 had another stent placed and  in May 2007 had four more stents placed;  the most recent ones being  done at the [facility] in May 2007.  He had been on Plavix for a year,  but that was stopped in May 2008 after he had been on Plavix for a year.  He has had some shortness of breath since his last cath but no chest  discomfort until the day of admission.   PHYSICAL EXAMINATION:  VITAL SIGNS:  At time of admission includes blood  pressure 150/58, pulse 84 regular, respirations regular, O2 sat  100% on 2 L.  HEENT:  Negative.  CHEST:  Clear.  HEART:  No murmur, gallop or rub.  ABDOMEN:  Obese, nontender.  No mass.  EXTREMITIES:  Weak pedal pulse, no edema, no phlebitis.   HOSPITAL COURSE:  Third set of point of care enzymes was elevated at  troponin of 0.11 and CK-MB of 2.5.  Repeat EKG showed evolving changes  of ischemia, and the patient was taken to the cath lab with cardiac cath  and stenting as noted above.  The patient tolerated the procedure well.  There was no aortic or left ventricular outflow gradient.  The culprit  lesion was a ruptured plaque in the mid LAD over the diagonal with  successful PTCA and also in-stent restenosis x2 with successful PTCA.  It was felt that the patient would need ongoing aspirin and Plavix as  well as aggressive risk factor reduction.  It was also felt that the  patient should have a restudy by catheterization in 6-8 weeks, and this  will be arranged with Dr. Riley Kill.  The patient did well post cath.  He  was treated with 18 hours of Integrilin.  He did not have any  significant arrhythmias.  There was no evidence of congestive heart  failure.  Attention was directed to his diabetic control.  He was seen  by the diabetes education  nurse.  It was felt that he would benefit from  outpatient diabetic management, and he was referred to the outpatient  [place] diabetes education program.  The patient also was felt to  benefit from the cardiac rehab program phase II outpatient program, and  he was given information about this.  Activity was gradually increased.  He was able to be discharged home improved on December 10, 2006.  At time of  discharge blood sugar was 144, sodium 138, potassium 4.5, BUN 21,  creatinine 0.93.  EKG one day prior to discharge shows pattern of old  inferior wall myocardial infarction and anterior ischemia.  Chest x-ray  done portable at the time of admission showed low volumes with bibasilar  atelectasis and heart size at upper limits of normal.   ADDITIONAL LABORATORY STUDIES:  Cholesterol on admission 128, LDL 74,  HDL 44, triglycerides 52.  Peak CK-MB was 257.  Peak total CK was 2051.  Peak troponin was 73.7.  Fibrin split products on admission slightly  elevated at 0.56.  B natriuretic peptide was 105.  Glycosylated  hemoglobin was 9.9; at discharge hemoglobin was 10.7, hematocrit 31.4,  white count 5800, platelets 166,000.  Liver function studies were  normal.   The patient is being discharged on a low sodium, heart healthy diabetic  diet.  He will be rechecked in the office in two weeks for an office  visit, EKG, BMET, and CBC.  We will plan for him to have a recath  electively in early September by Dr. Riley Kill.   DISCHARGE MEDICATIONS:  1. Aspirin 325 one daily.  2. Plavix 75 mg one daily.  3. Lisinopril 10 mg twice a day.  4. Simvastatin 80 mg daily in the evening.  5. Prozac 20 mg daily.  6. Glyburide 5 mg twice a day.  7. Toprol XL 50 mg daily.  8. Metformin 1000 mg twice a day.  9. Lantus 46 units subcu daily.  10.Nitrostat 1/150 sublingually p.r.n..  His weight at the time of      discharge is 108 kg.   CONDITION ON DISCHARGE:  Improved.            ______________________________  Cassell Clement, M.D.  TB/MEDQ  D:  12/10/2006  T:  12/10/2006  Job:  [id number]   cc:   Arturo Morton. Riley Kill, MD, Lacie Draft, M.D.

## 2010-10-16 NOTE — H&P (Signed)
[name], [name] NO.:  [id number]   MEDICAL RECORD NO.:  [id number]          PATIENT TYPE:  INP   LOCATION:  2807                         FACILITY:  [facility]   PHYSICIAN:  Cassell Clement, M.D. DATE OF BIRTH:  1940/06/10   DATE OF ADMISSION:  12/06/2006  DATE OF DISCHARGE:                              HISTORY & PHYSICAL   CHIEF COMPLAINT:  Chest pain.   HISTORY:  This is a 70 year old married Caucasian gentleman, admitted  from the emergency room with substernal chest pain.  He had the onset of  chest pain approximately 8 o'clock this morning while at work.  He  initially called his wife to come and get him, and then his coworkers  called 9-1-1.  He was brought by emergency ambulance to the ER.  His  first 2 sets of enzymes were negative in the emergency room, and the  third set was positive.  The patient had ongoing chest pain in the  emergency room, with new EKG changes on the second EKG.  The patient  reports that he had nausea and vomiting and weakness at the time of his  chest discomfort.  He has also had some tingling in his left arm.  The  patient does have significant past cardiac history.  He states that he  had catheterization with 2 stents placed in November 2002 for an acute  MI; and in March 2003 he had another stent placed, and in May 2007 he  had 4 more stents placed.  The most recent stents were done at the  [facility] in May 2007.  He was on Plavix for a year, and stopped Plavix  in May 2008 approximately 2 months ago.  Since his last cardiac  catheterization he has not been experiencing any exertional chest pain,  but has had shortness of breath.   SOCIAL HISTORY:  He is married.  He has 2 daughters living in the  [place] area, and that caused him to move from [place] up to  [place] to be near there 4 grandchildren.  He quit smoking 15 years  ago.  He rarely has anything to drink, but did have a beer yesterday at  a July 4 picnic.  The  patient works part-time on the weekends as a  Electrical engineer.   PREVIOUS SURGERY:  Includes cholecystectomy in 2007 at [facility].   ALLERGIES:  NO KNOWN DRUG ALLERGIES.   REVIEW OF SYSTEMS:  No history of peptic ulcer or GI bleed.  The patient  has been a diabetic for 20 years; blood sugars have been poorly  controlled, with blood sugars in the 300s usually and his A1c usually  runs 9-10.   MEDICATIONS:  1. Insulin N 54 units twice a day, with sliding scale regular insulin      twice a day.  2. Metformin 1000 mg twice a day.  3. Glyburide 2 tablets twice a day.  4. Lisinopril one twice a day.  5. Simvastatin one at bedtime.  6. Prozac 20 mg daily.  7. Aspirin 325 mg daily.  8. Fish oil over-the-counter twice a  day.  He stopped Plavix 2 months ago.   PHYSICAL EXAMINATION:  VITAL SIGNS:  Blood pressure 150/58, pulse 84 and  regular, respirations are regular.  Oxygen saturation 100% on 2 liters a  minute.  HEENT: No carotid bruits.  GENERAL:  His color is sallow; he is overweight.  CHEST:  Clear.  HEART:  Reveals no murmur, gallop, rub or click.  ABDOMEN:  Soft, nontender.  No bruits.  No masses.  EXTREMITIES:  Weak pedal pulses.  No phlebitis.  No edema.   LABS:  Point-of-care enzymes negative x2, but the third set after  recurrent chest pain in the emergency room is elevated at troponin 0.11,  CK-MB 2.5.  Chest x-ray in the emergency room shows borderline  cardiomegaly with no active disease.  EKG in the emergency room shows  normal sinus rhythm,  frequent and consecutive PVCs, and idioventricular  rhythm; also has some ST elevation in the inferior leads, with  reciprocal ST depression in lead 1 and AVL.   IMPRESSION:  1. Ongoing chest pain with positive enzymes.  The EKG changes were      consistent with acute myocardial infarction.  2. Known coronary disease, with prior stents -- total of 6 by three      prior catheterizations.  3. Diabetes mellitus, poorly  controlled by history.  4. Dyslipidemia.  5. Hypertensive cardiovascular disease.   DISPOSITION:  The patient is being taken to catheterization lab by Dr.  Shawnie Pons for Code stenting.  We are going to hold his metformin.  We are giving beta-blockers, aspirin, IV nitroglycerin and IV heparin.           ______________________________  Cassell Clement, M.D.     TB/MEDQ  D:  12/06/2006  T:  12/06/2006  Job:  [id number]   cc:   [facility] Dr. Dan Europe D. Riley Kill, MD, [facility]

## 2010-10-16 NOTE — Assessment & Plan Note (Signed)
OFFICE VISIT   Odonnell, Randall  DOB:  1940-12-13                                        December 28, 2008  CHART #:  [id number]   The patient is seen in [facility] office following coronary artery  bypass grafting x2 with left internal mammary to the distal left  anterior descending coronary and reverse saphenous vein graft to the  diagonal, done on 12/07/2008.  The patient is a 70 year old male with  extremely poorly controlled diabetes, obesity and left ventricular  dysfunction who originally presented with symptoms of congestive heart  failure.  Following the surgery, he has made steady progress on both, in  terms of making an effort to control his diabetes and control of  congestive heart failure.  He is increasing his physical activity  appropriately.  He has had no recurrent angina.  His pedal edema is  markedly improved.   His discharge medicines as outlined in his discharge summary.  He has  continued on glyburide, aspirin 81 mg, fluoxetine, metformin,  simvastatin, metoprolol, Plavix, Lasix and potassium.   His only complaint is some changes in the smell of his urine which he  has noticed for the last several days but without fever or chills.   PHYSICAL EXAMINATION:  General:  The patient is alert and neurologically  intact.  Cardiac:  His heart rhythm is regular with a rate of 80.  Chest:  His sternum is stable and well healed.  Lungs:  Clear  bilaterally.  Extremities:  His vein harvest sites are in the right  thigh is healing well without evidence of infection.  His pedal edema is  markedly improved even compared to his preop status.   A poor quality chest x-ray was done in the [facility].  However, it does appear that he has a small left effusion,  clear lungs on the right.   IMPRESSION:  The patient is making satisfactory recovery following  coronary artery bypass grafting with a history of hyperlipidemia,  diabetes,  myocardial infarction and multiple stents in his left anterior  descending coronary artery.  He now appears much more motivated to work  on his diabetes control, I have encouraged him to enroll in the cardiac  rehab program/[facility] at [facility] as this is closest to  his home.  He continues to work with Dr. Joseph Art on diabetic control.  He  will go by Dr. Koleen Nimrod office and will obtain UA and an urine culture to  rule out possibility of urinary tract infection.  I plan to see him back  in 4 weeks with a follow up PA and lateral chest x-ray.   Sheliah Plane, MD  Electronically Signed   EG/MEDQ  D:  12/28/2008  T:  12/29/2008  Job:  [id number]   cc:   Sondra Come, MD  Cassell Clement, M.D.

## 2010-10-16 NOTE — Discharge Summary (Signed)
NAMEERBY, SANDERSON NO.:  [id number]   MEDICAL RECORD NO.:  [id number]          PATIENT TYPE:  INP   LOCATION:  3743                         FACILITY:  [facility]   PHYSICIAN:  Luis Abed, MD, FACCDATE OF BIRTH:  1941/04/18   DATE OF ADMISSION:  10/06/2008  DATE OF DISCHARGE:  10/08/2008                               DISCHARGE SUMMARY   PROCEDURES:  1. Cardiac catheterization.  2. Coronary arteriogram.  3. Left ventriculogram.   PRIMARY FINAL DISCHARGE DIAGNOSIS:  Unstable anginal pain.   SECONDARY DIAGNOSES:  1. Hyperlipidemia with a total cholesterol of 149, triglycerides 122,      HDL 41, LDL 84 on medication.  2. Diabetes with a hemoglobin A1c of 11.4 this admission.  3. History of myocardial infarction in 2002.  4. Status post stents, total of 6.  5. Status post myocardial infarction in 2008 with percutaneous      transluminal coronary angioplasty.  6. Depression/anxiety.   TIME OF DISCHARGE:  35 minutes.   HOSPITAL COURSE:  Mr. [name] is a 70 year old male with known coronary  artery disease.  He had progressive edema and chest pain.  He came to  the hospital and was admitted for further evaluation.   His cardiac enzymes were negative for MI.  He was given IV Lasix x1 for  diuresis and this improved his lower extremity edema.  His metformin was  held and his cardiac enzymes were cycled.  His cardiac enzymes were  negative and he was taken to the cath lab on Oct 07, 2008.   At the cath, the LAD had a proximal 95% stenosis and the diagonal had an  ostial 90% stenosis.  The stents in the diagonal were patent with mild  in-stent restenosis.  The stented portion of the mid LAD had 50% in-  stent restenosis and the distal portion had 60% in-stent restenosis.  The circumflex had a 60-70% stenosis and the OM-1 had an ostial 50%  stenosis.  The OM-2 also had 50% stenosis.  The RCA had a proximal 30%  and the mid 40% stenosis.  His EF is 45-50% with  apical/inferoapical  severe hypokinesis, but no MR.  The films were reviewed by Dr. Clifton James  who felt that he had significant stenosis at the site of prior plaque  rupture in 2008, which was treated with angioplasty.  His proximal LAD  lesion involved the large diagonal branch and Dr. Clifton James felt that  bypass surgery was best option.  A CVTS consult was called.   He was seen in consult by Dr. Jaynie Collins who did recommend bypass surgery.  The situation was discussed with the patient and his wife.  Pre-CABG  Dopplers were performed.  A 2D echocardiogram was also performed, which  showed the left ventricle not well visualized and possible inferobasal  hypokinesis.  The valves were poorly visualized as well.   On Oct 08, 2008, Mr. [name] was ambulating without chest pain or  shortness of breath.  He was evaluated by Dr. Myrtis Ser and considered stable  for discharge with close outpatient followup with Dr. Patty Sermons, his  primary cardiologist.   DISCHARGE INSTRUCTIONS:  His activity level is to be increased  gradually.  He is not to do any heavy lifting and no driving for 3 days.  He is to stick to a low-sodium heart-healthy diabetic diet.  He is to  follow up with Dr. Patty Sermons as soon as possible and call for an  appointment.  He is to follow up with Dr. Joseph Art as well.   DISCHARGE MEDICATIONS:  The list may be incomplete, but these are  believed to be the patient's current medications.  1. Glyburide 10 mg b.i.d.  2. Aspirin 325 mg daily.  3. Lisinopril 20 mg a day.  4. Fluoxetine 20 mg a day.  5. Misoprostol 200 mcg t.i.d. and at bedtime.  6. Metoprolol 50 mg daily.  7. Fish oil 1000 mg 2 tablets daily.  8. Nitroglycerin p.r.n.  9. Lantus 60 units b.i.d.  10.Novolin 30 units b.i.d.  11.Metformin 850 mg as at home, hold for 48 hours.  12.Simvastatin as at home.      Theodore Demark, PA-C      Luis Abed, MD, [facility]  Electronically Signed    RB/MEDQ  D:  10/08/2008  T:   10/09/2008  Job:  [id number]   cc:   Cassell Clement, M.D.  Dr. Joseph Art

## 2010-10-16 NOTE — Assessment & Plan Note (Signed)
OFFICE VISIT   Odonnell, Randall  DOB:  [birth year]                                        January 11, 2009  CHART #:  [id number]   The patient returns to the office today in followup after his recent  coronary artery bypass grafting done December 07, 2008.  He was previously  seen 2 weeks ago in the office in [facility].  Followup chest x-ray  showed a small left pleural effusion, but was of poor quality film done  at the [facility] in [facility].  He returns today for a  followup check and also for a repeat chest x-ray.  Since last seen, he  notes that his glucoses are starting to in the past several days running  up over 300 fasting in the morning.   PHYSICAL EXAMINATION:  VITAL SIGNS:  His blood pressure 118/70, pulse is  106, respiratory rate is 18, O2 sats 98%.  CHEST:  His sternum is stable and well-healed.  Vein harvest sites were  also healing well.  LUNGS:  Clear bilaterally.   Followup chest x-ray shows clear lung fields bilaterally with resolution  of 1 small left pleural effusion.   He continues on glyburide, aspirin, fluoxetine, metformin, simvastatin,  Lantus, fish oil, metoprolol, Lasix, Plavix, potassium, Ultram.   Overall, the patient is making good progress though it is somewhat  concerning his diabetes is again getting out of control.  Preoperative,  hemoglobin A1c was 9.7.  He notes that he has appointment tomorrow to  follow up with his diabetes physician through the [facility]  office.  I have strongly encouraged him to pay attention and in followup  on this visit, as his control of his diabetes will be a long-term  situation.  It has been a long-term difficult situation to control.  He  has enrolled in the cardiac rehab program heart stress through [facility] and he should start this in the next 1-2 weeks.   I have not made the patient return appointment to see me as he is  closely followed by Dr.  Patty Sermons, Dr. Joseph Art, and the [facility] for his diabetes.   Sheliah Plane, MD  Electronically Signed   EG/MEDQ  D:  01/11/2009  T:  01/12/2009  Job:  [id number]   cc:   Cassell Clement, M.D.  Sondra Come, M.D.

## 2010-10-16 NOTE — H&P (Signed)
[name], KIMPLE NO.:  [id number]   MEDICAL RECORD NO.:  [id number]          PATIENT TYPE:  INP   LOCATION:  NA                           FACILITY:  [facility]   PHYSICIAN:  Sheliah Plane, MD    DATE OF BIRTH:  [birth year]   DATE OF ADMISSION:  DATE OF DISCHARGE:                              HISTORY & PHYSICAL   REQUESTING PHYSICIAN:  Cassell Clement, MD   PRIMARY CARE PHYSICIAN:  Sondra Come, MD, [facility].   REASON FOR CONSULTATION:  The patient is seen for congestive heart  failure and coronary occlusive disease.   HISTORY OF PRESENT ILLNESS:  The patient has a long complicated cardiac  history.  He was initially seen by me on Oct 07, 2008 at [facility].  He is a 70 year old male and poorly-controlled diabetic who presented to  Dr. Joseph Art because of increasing swelling in his feet and substernal  chest tightness and exertional shortness of breath.  His cardiac history  noted that he had a myocardial infarction while living in [place] in  2002.  He notes that two stents were placed.  He had two additional  stents placed at [facility] in 2007.  Since then, he had 4 more stents  which were drug coated placed, the details of these are unknown.  He was  maintained on Plavix for 1 year and 3 months after stopping his Plavix.  He presented with acute myocardial infarction in July 2008 where  angioplasty was performed.  After discharge, he did not return for  followup.  He comes in with increasing symptoms in May and was  catheterized by Dr. Clifton James and I saw him at that time.  He had  progression of disease in the proximal LAD and diagonal with decreased  LV function.  Coronary artery bypass grafting was considered and  discussed with the patient to the point where he was to be on the  schedule and then decided that he wanted to go [place] and declined to proceed with surgery.  Since that time, he  has decided not to go on his cruise.   He has gone back and seen Dr.  Patty Sermons for a followup visit and continued on diuretics and Imdur.  He  was to start on Pl[facility], however[facility]   He now returns to the office because of progressive symptoms to proceed  with coronary artery bypass grafting.  Unfortunately, it does not appear  that his diabetes is any better controlled than it was.  Recent  hemoglobin A1c was 10.1.  He does note increasing dyspnea on exertion  and chest tightness with exertion but denies any definite chest pain.   PAST MEDICAL HISTORY:  As noted above.  1. Hyperlipidemia.  2. Type 2 diabetes for 22 years.  3. Remote smoker, quit 15 years ago.   PAST SURGICAL HISTORY:  1. 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  2. Arthroscopy on both knees.  3. Appendectomy.  4. Cholecystectomy.   SOCIAL HISTORY:  The patient is married.  Retired  as a Sports coach.   FAMILY HISTORY:  The patient's mother died at age 10 of Alzheimer's.  His family has a long history of cardiac disease.  Father died at home  of unknown cause at age 36.   CURRENT MEDICATIONS:  1. Glyburide 10 mg b.i.d.  2. Aspirin 325 mg a day.  3. Lisinopril 20 mg a day.  4. Fluoxetine 20 mg a day.  5. Metformin 1000 mg b.i.d.  6. Simvastatin once a day, unknown dose.  7. Novolin 30 units b.i.d.  8. Lantus 60 units b.i.d.  9. Nitroglycerin p.r.n.  10.Fish oil 1000 mg b.i.d.  11.Lopressor 50 mg a day.  12.Isosorbide 30 mg a day.  13.Diuretic, unknown.  Thinks it is Lasix, does not know the dose.  14.Misoprostol 200 mcg t.i.d. and at bedtime.   ALLERGIES:  None known.   REVIEW OF SYSTEMS:  The patient denies any weight loss or gain.  Has  chest pain, chest tightness, shortness of breath, palpitations, frequent  urination, pain in his legs with ambulation, especially his knees,  history of depression, and nervousness.  Denies amaurosis or TIAs.   PHYSICAL EXAMINATION:  VITAL SIGNS:  Blood pressure is  112/61, heart  rate is 80, respiratory rate is 18, and O2 sats 97%.  He is 5 feet 8  inches tall and 247 pounds.  NECK:  He has no carotid bruits.  LUNGS:  Clear bilaterally.  CARDIAC:  Regular rate and rhythm without murmur or gallop.  His lower  extremity edema appears improved from previous exam.  ABDOMEN:  Moderately protuberant.  He has a obese abdomen without  palpable masses.   LABORATORY DATA:  He has no current laboratory findings.  The patient's  cardiac catheterization films were again reviewed.  He has luminal  irregularities in the right coronary artery, 80% stenosis of the  proximal LAD with multiple stents in the LAD, and also 80-90% stenosis  of the diagonal.  Ejection fraction is approximately 30% with  anteroapical hypokinesis.   IMPRESSION:  With the patient's continued symptoms and critical and  progressive coronary anatomies, especially involving the left anterior  descending and diagonal, again coronary artery bypass grafting has been  recommended to him and he is agreeable with proceeding.  We will obtain  a followup echocardiogram to evaluate his LV function compared to when  he had his catheterization.  I made him an appointment [facility], as this has been recommended to him on other occasions  and he never made the appointment.  Prior to surgery, we will hold his  metformin and lisinopril for 36-48 hours and tentatively plan for  surgery on December 07, 2008.      Sheliah Plane, MD  Electronically Signed     EG/MEDQ  D:  11/24/2008  T:  11/25/2008  Job:  [id number]   cc:   Cassell Clement, M.D.  Sondra Come, M.D.

## 2010-10-16 NOTE — Cardiovascular Report (Signed)
NAMEJARMARCUS, WAMBOLD               ACCOUNT NO.:  [id number]   MEDICAL RECORD NO.:  [id number]          PATIENT TYPE:  INP   LOCATION:  2913                         FACILITY:  [facility]   PHYSICIAN:  Arturo Morton. Riley Kill, MD, FACCDATE OF BIRTH:  1941-04-09   DATE OF PROCEDURE:  DATE OF DISCHARGE:                            CARDIAC CATHETERIZATION   INDICATIONS:  Mr. Lebleu is a 70 year old gentleman previously cared for  at the [facility] in [place].  He has also had a stent placed at the [facility] in  [place].  To summarize, it appears that the patient has multiple coronary  stents.  There are stents back from 2001 which appear to be non drug-  eluting Guidant Multilink stents potentially in the LAD.  There appears  to be several stents in his diagonal and there are stickers for least 4  TAXUS drug-eluting Express stents.  The patient developed chest pain and  had waxing EKG changes.  Enzymes became positive.  He was seen by Dr.  Patty Sermons in the emergency room and urgent cardiac catheterization  recommended.  The patient was brought to the catheterization laboratory  and agreeable to proceed.   PROCEDURE:  1. Left heart catheterization  2. Selective coronary arteriography  3. Selective left ventriculography.  4. PTCA without stenting of the mid left anterior descending artery at      the site of ruptured plaque.  5. PTCA of in-stent restenosis of the mid distal left anterior      descending stent.  6. PTCA of a distal left anterior descending artery at an area of in-      stent restenosis.   DESCRIPTION OF PROCEDURE:  The patient was brought to the  catheterization laboratory and prepped and draped in usual fashion.  Through an anterior puncture, the femoral artery was easily entered and  a 6-French sheath was placed.  The patient had been given unfractionated  heparin and ACT was checked.  Views of the left and right coronary  arteries were obtained in multiple angiographic projections.  Central  aortic and left ventricular pressures were measured with pigtail.  Ventriculography was then performed in the RAO projection.  Following  the pressure pullback, we analyzed the situation.  The patient has at  least what appears to be 2 areas of in-stent restenosis.  The distal LAD  stent and both diagonal stents appeared to be widely patent and these  may well represent the previously placed Taxus stents.  There is also a  new ruptured plaque just after the takeoff of the diagonal branch.  The  diagonal itself has about 60% to 70% ostial stenosis.  We discussed the  various options.  I elected to give the patient both unfractionated  heparin and eptifibatide because of the multiple stents.  We gave him an  oral load of clopidogrel of 600 mg.  ACT was checked and found to be  appropriate for percutaneous intervention.  A JL-3 guiding catheter was  utilized with a Research scientist (physical sciences).  We were able to cross the lesion where  the ruptured plaque was laying over the diagonal, and there  was a 2.25 x  12 balloon which resulted in substantial improvement in the appearance  of the artery at the site of clotting and ruptured plaque.  There was  some very mild compromise of the diagonal branch with the balloon laying  across this area.  However, TIMI III flow remained intact.  We then went  down in the midvessel, and this was dilated with the 2.25-mm balloon.  This was then removed, and the cutting balloon was taken down to the  site of the mid distal stent.  A 2.5 cutting balloon was then used to  dilate the area of in-stent restenosis.  This was taken up to 6-7  atmospheres.  We then attempted to pass the cutting balloon down into  the second LAD stent which had in-stent restenosis. The third stent does  not have any restenosis and looks widely patent.  We were unable to get  the balloon across the second stent, and therefore had to go back down  with a 2.5 x 15 Maverick balloon.  This 2.5 x 15 Maverick  balloon was  then taken up to 9 atmospheres in the distal stent, and there was marked  improvement in the appearance of the artery with less than 30% residual  narrowing at the distal stent site.  The mid distal stent was then  dilated with a 2.5-mm balloon also to 9 atmospheres and this was  markedly improved.  We then laid a 2.5 mm by 15 Maverick balloon across  the site of ruptured plaque and this was taken up to 6-7 atmospheres.  This resulted in a marked improvement in the appearance of the artery.  There was perhaps a little bit of further compromise of the diagonal,  but diagonal flow remained intact.  We then debated whether to place a  drug-eluting stent in the area of ruptured plaque.  However, the  angiographic result to acute dilatation was excellent.  Moreover, there  are two areas of in-stent restenosis distally, and  stent deployment  would require laying across the diagonal branch which could result in  diagonal occlusion.  Of interest, the patient did have a fair amount of  increase in chest pain and some ST elevation with dilatation of the  balloon laying across the diagonal.  Based on the high-grade potential  for in-stent re-narrowing distally, we elected not to place a drug-  eluting stent at the proximal ruptured plaque lesion.  Also the  angiographic result was excellent.  Despite his diabetes, it would be  anticipated that he could easily developed runoff problems distally from  the in-stent restenosis from the stents from 1991, and we would be  jailing the diagonal.  The patient was painfree and there was marked  improvement in the appearance of the artery so we elected to complete  the procedure and sew the femoral sheath into place.  He was taken to  the coronary care unit in satisfactory clinical condition.  I then  reviewed with his wife in great detail the treatment strategy.  I also  called Dr. Patty Sermons and informed him of results, and my   recommendations.   The patient was taken to the CCU in satisfactory clinical condition.   HEMODYNAMIC DATA.:  1. Initial central aortic pressure 165/68, mean 103.  2. Left ventricular pressure 174/32.  3. No gradient of pullback across aortic valve.   ANGIOGRAPHIC DATA.:  1. Ventriculography in the RAO projection reveals fairly vigorous      systolic function.  Estimated ejection fraction would be at least      around 50%.  There was probably hypokinesis of the apical segment      in the distribution of the infarct related artery.  2. The left main is free of critical disease.  3. The LAD proximally is fairly heavily calcified. It is not      critically narrow at the most proximal portion.  However, the      vessel divides fairly proximally into a major diagonal, and a large      LAD.  The major diagonal has been previously stented with what      appears to be drug-eluting platforms, specifically TAXUS.  The      ostomy of the diagonal has 60% narrowing and then there are two      drug-eluting stents, both of which appear to be widely patent.  The      LAD proper just after the takeoff of the diagonal has evidence of a      95% ruptured plaque.  It is hazy.  Following multiple balloon      dilatations this is reduced to 30% with an excellent angiographic      appearance and smooth lumen.  Just distal to this is another area      of 30-40% narrowing.  In the mid distal vessel, there is evidence      of in-stent restenosis.  This is probably 90-95%, and following      balloon dilatation was 30%.  There is a second stent distally which      also demonstrates in-stent restenosis.  It is assumed that these      represent the 2001 Guidant Penta stents.  This was also dilated,      and resulted in less than 30% narrowing.  The most distally placed      stent is widely patent without significant restenosis and the      apical vessel bifurcates.  4. The circumflex provides an intermediate  distribution marginal      branch which comes off proximally in the marginal.  There is mild      luminal irregularity of the large intermediate branch but it goes      out well over the apical tip and is widely patent.  The AV      circumflex has about 40-50% area of narrowing in the takeoff of the      small side branch, and after the AV groove branch, the circumflex      marginal has about 50% narrowing.  5. The right coronary artery is calcified and does have some luminal      irregularity. There is probably 20-30% narrowing in the proximal      bend.  There is another 20% narrowing in the midvessel and 10-20%      narrowing distally.  The PDA and two posterolateral branches are      widely patent.   CONCLUSIONS:  1. Multivessel coronary artery disease with well-preserved left      ventricular function and apical wall motion abnormality.  2. Acute myocardial infarction due to ruptured plaque in the mid left      anterior descending artery.  3. In-stent restenosis of the first and second stents in the mid      distal LAD.  4. Continued patency of two diagonal stents and an apical LAD stent.  5. Successful percutaneous angioplasty of the ruptured plaque as well  as two areas of in-stent restenosis.   DISPOSITION:  We analyzed this carefully in the laboratory.  The  diagonal comes off at the takeoff just proximal to the takeoff of the  ruptured plaque.  After initial balloon dilatation on eptifibatide, the  angiographic results were really excellent.  We considered placing a  drug-eluting stent across this area, but would effectively jail the  diagonal and potentially pinch this off.  Moreover, there was evidence  of in-stent restenosis of two distally placed stents in the LAD and with  balloon dilatation the likelihood of recurrent target vessel  revascularization is fairly high in this distribution.  As a result, we  elected not to employee to Callan Yontz strategy of stenting in  myocardial  infarction.  The patient will be treated medically at the present time.  I have suggested that he have a repeat cardiac catheterization 8 weeks.  The likelihood of need for repeat target vessel revascularization is  fairly high.  The LAD could be stented with a drug-eluting stent. If the  two areas of in-stent restenosis remain opened that would be preferable  to placing three more drug-eluting stents in the LAD.  Re-look in 6-8  weeks would give some idea as to whether or not this will need to be  treated.  If everything is patent in 6 weeks, then the patient could  potentially undergo a repeat catheterization at 6-9 months post PCI.  He  will follow up Dr. Patty Sermons.  If he has any problems, he is to let us  know and we will be happy to help with his management.      Arturo Morton. Riley Kill, MD, [facility]  Electronically Signed     TDS/MEDQ  D:  12/06/2006  T:  12/07/2006  Job:  [id number]   cc:   [facility], [facility]  Cassell Clement, M.D.

## 2010-10-16 NOTE — Consult Note (Signed)
[name], WHITEHURST NO.:  [id number]   MEDICAL RECORD NO.:  [id number]          PATIENT TYPE:  INP   LOCATION:  3743                         FACILITY:  [facility]   PHYSICIAN:  Sheliah Plane, MD    DATE OF BIRTH:  01/05/1941   DATE OF CONSULTATION:  10/07/2008  DATE OF DISCHARGE:                                 CONSULTATION   REQUESTING PHYSICIAN:  Verne Carrow, MD   FOLLOWUP CARDIOLOGIST:  Cassell Clement, MD   PRIMARY CARE PHYSICIAN:  Dr. Fara Boros, [facility]   REASON FOR CONSULTATION:  Congestive heart failure and coronary  occlusive disease.   HISTORY OF PRESENT ILLNESS:  The patient is a 70 year old poorly  controlled diabetic who presented to Dr. Joseph Art yesterday because of  swelling in his feet because he also mentioned that he had had  substernal chest pain with exertion and increasing shortness of breath  with exertion.  He was referred to the [facility]  emergency room and then was admitted to [facility] yesterday.  He underwent cardiac catheterization today.   His history is long and complicated and is very poorly documented,  exactly what he has had done but in 2002 he had myocardial infarction in  [place].  He says he had two stents placed.  He did not think there were  drug-coated stent in 2007 at [place], [place].  He had four more stents  placed, which were drug coated, was maintained on Plavix for 1 year 3  months.  After stopping his Plavix, presented with acute myocardial  infarction in July 2008 to [facility] where Dr. Riley Kill did  angioplasty without further stent placement.  He saw Dr. Riley Kill and Dr.  Patty Sermons, each one time following this and then no further Cardiology  followup.  He now comes in and repeat cardiac catheterization was done  by Dr. Clifton James, which shows severe LAD and diagonal disease primarily  with an extensive stenting of the distal vessels.   CARDIAC RISK FACTORS:  The patient  denies hypertension.  He does have  hyperlipidemia.  He is not sure if he takes simvastatin or other statin  medication or not.  He has type 2 diabetes for 22 years.  His most  recent hemoglobin A1c was 2 months ago and was 14.3.  He is a remote  smoker, quit 15 years ago.  Denies any previous stroke.  He has history  of Bell palsy 15 years ago.   PAST SURGICAL HISTORY:  Include amputation of a portion of the right  index finger.  He has had arthroscopy in both knees, appendectomy, and  cholecystectomy in 2007 at [facility].   SOCIAL HISTORY:  The patient is married, retired as a Sports coach.  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   FAMILY HISTORY:  Mother died at 70 of Alzheimer's.  He had a long  history of cardiac disease.  Father died at home of unknown cause at age  45.   The patient's medication  list is incomplete because he cannot remember  all of his medications, the med rec sheath and other meds listed had  been obtained from his records, were all inconsistent; however, the best  I can tell, currently he is on glyburide 10 mg twice a day, aspirin 325  a day, lisinopril 20 a day, fluoxetine 20 mg a day, misoprostol 200 mcg  three times a day and at night, metoprolol 50 mg a day, fish oil 1000 mg  twice a day, Lantus 60 units twice a day, Novolin 30 units a day,  metformin 1000 mg b.i.d., and probably on simvastatin of unknown dose.  The patient's family will bring his medical medication box to the  hospital to have it carefully reviewed by the pharmacist, so we have an  accurate medication reconciliation sheet on the patient's chart.   DRUG ALLERGIES:  None known.   CARDIAC REVIEW OF SYSTEMS:  Positive for chest pain, lower extremity  edema, exertional shortness of breath.  He denies resting shortness of  breath.  Denies palpitations, syncope, presyncope.  General review of  systems, has  had weight gain.  Denies amaurosis or TIAs.  Denies fever,  chills or night sweats.  Does occasionally have dizziness but and  unsteadiness on his.  He denies any urinary problems.  Denies  psychiatric history.   PHYSICAL EXAMINATION:  VITAL SIGNS:  Blood pressure is 111/58, heart  rate 68, respiratory rate is 18, O2 saturations 100% on room air, and  temperature is 98, the patient is 5 feet 8 inches tall, 247 pounds.  GENERAL:  He is awake, alert, neurologically intact, obviously  significant obesity.  LUNGS:  Clear.  CARDIAC:  Regular rate and rhythm without murmur or gallop.  ABDOMEN:  Obese abdomen.  Internal abdominal organs are not palpable.  He has a cath site in the right groin without significant hematoma.  He  has +1 DP and PT pulses bilaterally.   Cardiac cath films are reviewed.  The patient has high grade LAD  diagonal disease but diffuse disease in the LAD with multiple sites of  stents and/or coronary artery calcification.  The diagonal has high-  grade proximal stenosis.  His proximal circumflex has a 60% lesion, 50-  60% mid right lesion.   IMPRESSION:  Poorly controlled diabetic with multiple drug-coated stents  to the point of questioning metal jacket of the left anterior descending  and presents with heart failure symptoms with anterior apical, severe  hypokinesis, overall ejection fraction 40%.  I have discussed with the  patient the surgical options and have explained to him the issue of the  multiple stents in his left anterior descending.  Continue to further  review the study before making final decision about potentially bypass  surgery involving the left anterior descending.  The risks and options  of surgery including death, infection, stroke, myocardial infarction,  increased risk of infection secondary to his poorly controlled diabetes  were all discussed.      Sheliah Plane, MD  Electronically Signed     EG/MEDQ  D:  10/07/2008  T:   10/08/2008  Job:  [id number]   cc:   Cassell Clement, M.D.  Dr Fara Boros [facility]

## 2010-10-16 NOTE — Discharge Summary (Signed)
[name], [name]               ACCOUNT NO.:  [id number]   MEDICAL RECORD NO.:  [id number]          PATIENT TYPE:  INP   LOCATION:  2020                         FACILITY:  [facility]   PHYSICIAN:  Sheliah Plane, MD    DATE OF BIRTH:  1941/05/09   DATE OF ADMISSION:  12/07/2008  DATE OF DISCHARGE:  12/12/2008                               DISCHARGE SUMMARY   ADMITTING DIAGNOSES:  1. History of coronary artery disease (status post percutaneous      transluminal coronary angioplasties with multiple stents).  2. History of myocardial infarction in 2002 as well as in 2008.  3. History of diabetes mellitus type 2.  4. History of hyperlipidemia.  5. History of remote tobacco abuse (quit approximately 15 years ago).   DISCHARGE DIAGNOSES:  1. History of coronary artery disease (status post percutaneous      transluminal coronary angioplasties with multiple stents).  2. History of myocardial infarction in 2002 as well as in 2008.  3. History of diabetes mellitus type 2.  4. History of hyperlipidemia.  5. History of remote tobacco abuse (quit approximately 15 years ago).  6. Acute blood loss anemia (last hematocrit 30.1).  7. Postoperative thrombocytopenia.   PROCEDURE:  Coronary artery bypass graft x2 (left internal mammary  artery to left anterior descending, saphenous vein graft to diagonal,  endoscopic vein harvest to the right side) by Dr. Tyrone Sage on December 07, 2008.   HISTORY OF PRESENT ILLNESS:  This is a 70 year old Caucasian male with  past medical history of coronary artery disease (multiple PTCAs and  stents, MI x2, diabetes mellitus type 2 (rarely poorly controlled),  hyperlipidemia, remote tobacco abuse who presented to his primary care  physician (Dr. Joseph Art) in May 2010 with complaints of substernal chest  pain, exertional shortness of breath, and feet swelling.  The patient  presented to [facility] Emergency Room for further evaluation and  treatment.  He underwent a  cardiac catheterization on Oct 07, 2008 by Dr.  Clifton James.  He was found to have progression of disease in the proximal  LAD and diagonal with decreased left ventricular function.  A  Cardiothoracic consultation was obtained with Dr. Tyrone Sage and coronary  bypass grafting surgery was recommended at that time.  However, the  patient had plans to go on a [place] cruise and declined surgery  at that time.  The patient did follow up with Dr. Patty Sermons as well as  Dr. Tyrone Sage on November 24, 2008.  At that time, the patient had complaints  of increasing dyspnea on exertion and chest tightness with exertion.  He  then agreed to undergo coronary bypass grafting surgery, he was admitted  on December 07, 2008, for this.   BRIEF HOSPITAL COURSE STAY:  The patient was afebrile and  hemodynamically stable.  He was extubated without difficulty early  evening of surgery.  He was weaned off his drips as tolerated.  Swan, A-  line, chest tubes were removed early in his postoperative course.  He  was found to have acute blood loss anemia.  He did not require any  postoperative  transfusions. The patient was volume overloaded and  diuresed accordingly.  As previously stated, the patient has a history  of poorly controlled diabetes mellitus.  Preop hemoglobin A1c was 9.8.  Initially, his CBGs were elevated postoperatively, but gradually his  home medicine regimen was resumed with good control of his glucose.  He  was then transferred from the intensive care unit to [facility] for further  convalescence.  He continued to progress with cardiac rehab.  He was  still on a couple liters of oxygen via nasal cannula, this was weaned  off as tolerated.  The patient was found to have some serosanguineous  oozing from the inferior sternal wound on postoperative day #3.  There  was no erythema purulence or sternal instability noted.  Currently on  postoperative day #4, he had a T-max of 100.3, but has been afebrile the  last 12  hours, heart rates in 80s, BP 123/70, and O2 sat 93% on room  air.  The patient was found to have 2 hypoglycemic episodes (CBG 53 and  67).  He was given juice, subsequent CBG was then 84.  The patient did  not eat dinner last evening, but did receive his full dose of insulin,  which is why he had his hypoglycemia.   PHYSICAL EXAMINATION:  CARDIOVASCULAR:  Regular rate and rhythm.  PULMONARY:  Decreased bases bilaterally.  ABDOMEN:  Soft, nontender.  Bowel sounds present.  EXTREMITIES:  Mild lower extremity edema, right greater than left.  Right lower extremity wound is clean and dry.  Sternum is solid.  No  erythema, slight serosanguineous ooze from the inferior sternal wound.   Provided that the patient remains afebrile and hemodynamically stable,  he will be discharged on December 12, 2008.   LATEST LABORATORY STUDIES:  CBC done July 10, H&H of 10.4 and 30.1  respectively, white blood cell count 10,300, and platelet count 140,000.  Last BMET done on July 10, potassium 4, BUN and creatinine 25 and 1.13  respectively.  Last chest x-ray done on July 10 showed interval  improvement in lung aeration with mild basilar atelectasis and small  bilateral pleural effusions.   DISCHARGE INSTRUCTIONS:  Activity, no driving or lifting more than 10  pounds.  He is to continue with his breathing exercises daily, to walk  every day, and increase his frequency and duration as tolerated.  Diet,  he is to remain on a low-fat, low-salt, carbohydrate-modified, medium-  caloric diet.   WOUND CARE:  The patient may shower.  He is to cleanse his wound with  mild soap and water  He is to call the office if any wound problems  arise.   FOLLOWUP APPOINTMENTS:  1. The patient needs to contact, Dr. Joseph Art or a further diabetes      management.  2. The patient needs to contact Dr. Yevonne Pax office for followup in      2 weeks.  3. The patient will be contacted by Dr. Dennie Maizes office to have a      followup  appointment in 3 weeks.  A chest x-ray will be obtained      prior to this appointment.   DISCHARGE MEDICATIONS:  1. Glyburide 10 mg p.o. 2 times daily.  2. Enteric-coated aspirin 81 mg p.o. daily.  3. Fluoxetine 20 mg was p.o. daily.  4. Metformin 1000 mg p.o. 2 times daily.  5. Simvastatin 80 mg p.o. nightly.  6. Metoprolol 50 mg p.o. daily.  7. Tums 2 tablets p.o. p.r.n.  8. Plavix 75 mg p.o. daily.  9. Lasix 40 mg p.o. daily.  10.KCl 20 mEq p.o. daily, both the dosages of Lasix and potassium will      be determined at the time of discharge.  11.Ultram 50 mg 1-2 tablets every 6 hours as needed for pain.  12.Lantus insulin 60 units subcu 2 times daily.  Please note that the      dosage may be changed prior to discharge depending on CBG at the      day of discharge.      Doree Fudge, [place]      Sheliah Plane, MD  Electronically Signed    DZ/MEDQ  D:  12/11/2008  T:  12/12/2008  Job:  [id number]   cc:   Cassell Clement, M.D.  Sheliah Plane, MD  Dr. Joseph Art

## 2010-10-16 NOTE — H&P (Signed)
[name], [name] NO.:  [id number]   MEDICAL RECORD NO.:  [id number]          PATIENT TYPE:  INP   LOCATION:  3743                         FACILITY:  [facility]   PHYSICIAN:  Therisa Doyne, MD    DATE OF BIRTH:  06/30/1940   DATE OF ADMISSION:  10/06/2008  DATE OF DISCHARGE:                              HISTORY & PHYSICAL   PRIMARY CARE PHYSICIAN:  Dr. Joseph Odonnell.   PRIMARY CARDIOLOGIST:  Arturo Morton. Riley Kill, MD, [facility]   CHIEF COMPLAINT:  Shortness of breath.   HISTORY OF PRESENT ILLNESS:  This is a 70 year old white male with past  medical history significant for diabetes mellitus as well as an  extensive coronary artery disease history who presents for evaluation of  progressive exertional chest pain, shortness of breath and lower  extremity edema.  Historically the patient has an extensive history of  coronary artery disease. In 2002 and 2003, he had multiple myocardial  infarctions as well as multiple percutaneous coronary interventions. He  did well until July 2008 and at that time he had anterior ST-segment  elevation myocardial infarction. The culprit lesion was in the LAD and  he underwent cutting balloon angioplasty of this lesion. A stent was not  placed because it was felt it would jail the diagonal vessel and it was  recommended he return for a repeat cardiac catheterization to see if  there is any evidence of restenosis or any areas that would benefit from  revascularization. For unclear reasons this cardiac catheterization was  never performed.   Over the past 2 weeks the patient has noted progressive bilateral lower  extremity edema and over the past 1 week, he complains of progressive  exertional chest pain, dyspnea on exertion and diaphoresis.  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  These episodes of exertional discomfort are relieved by rest.   He went  and saw his primary care [name] today.  He  was concerned by his  symptoms and sent him to the emergency department.  Currently he is  denying any chest pain.  He denies any recent infectious syndrome,  denying fevers, chills, cough,  nausea, vomiting or diarrhea.  He has  stable two-pillow orthopnea and denies paroxysmal nocturnal dyspnea or  syncope.   In the outside hospital the patient received aspirin, Lasix and was  started on heparin drip.   PAST MEDICAL HISTORY:  1. Coronary artery disease as documented above, multiple prior      myocardial infarctions, multiple prior percutaneous coronary      interventions. He had stents in the apical  LAD as well as two      stents in diagonal vessel. In July 2008, he had an anterior ST-      segment elevation myocardial infarction with culprit lesion in the      mid-LAD. He underwent cutting balloon to this lesion.  He also had      multiple areas of focal in-stent restenosis in his mid LAD and  distal LAD and underwent angioplasty of these lesions.  2. Diabetes mellitus.  3. Depression/ anxiety.   SOCIAL HISTORY:  The patient is retired, lives at home with his wife.  Denies any tobacco, alcohol or drugs.   FAMILY HISTORY:  Negative for cardiovascular disease.   ALLERGIES:  NO KNOWN DRUG ALLERGIES.   MEDICATIONS:  Please note the patient is unsure of the exact dosages and  names of these medications but he reports that he is on the following:  1. Metformin 850 mg t.i.d.  2. Glyburide 5 mg b.i.d.  3. Lantus 60 units b.i.d.  4. Insulin 30 units b.i.d. he is unsure if this is NovoLog or Novolin.  5. Aspirin 325 mg daily.  6. Prozac 20 mg daily.  7. Simvastatin daily.  8. Fish oil daily.  9. Lisinopril 20 mg daily.  10.Metoprolol 50 mg daily.   PHYSICAL EXAMINATION:  VITAL SIGNS:  Temperature afebrile, blood  pressure is 110/51, pulse 51, respirations 16, oxygen saturation 100% on  2 liters nasal cannula.  GENERAL:   In no acute distress.  HEENT: Normocephalic, atraumatic.  Pupils equal, round, reactive to  light and accommodation.  Extraocular muscles intact.  NECK:  Supple.  There is no lymphadenopathy or jugular venous  distention.  No masses.  CARDIOVASCULAR:  Regular rate and rhythm.  No murmurs, rubs or gallops.  CHEST:  Clear to auscultation bilaterally.  ABDOMEN:  Positive bowel sounds, soft, nondistended.  EXTREMITIES:  2+ lower extremity edema bilaterally pitting.  There is 2+  dorsalis pedis pulses bilaterally.  The femoral pulses are 2+ and  symmetric bilaterally with no evidence of bruits.   PERTINENT LABORATORY DATA:  1. Shows a normal CBC as well as normal renal function with BUN of 27,      creatinine 1.1, glucose 82.  BNP is 47, troponin x2 were negative.      CPK-MB  are normal.  2. EKG shows normal sinus rhythm at 75 beats per minute; evidence of      prior inferior myocardial infarction, nonspecific  ST-T wave      changes anterolaterally which are largely unchanged from his EKG      from July of 2008.  3. Chest X-Ray: No acute cardiopulmonary disease.   ASSESSMENT/PLAN:  [name] is a  70 year old white male with past  medical history significant for diabetes as well as extensive history of  coronary artery disease status post multiple myocardial infarctions and  percutaneous coronary interventions who presents with progressive  exertional dyspnea on exertion, chest pain and lower extremity edema for  the past 2 weeks.   1. Admit the patient to [facility] Cardiology service.   1. Exertional chest pain and dyspnea on exertion.  His symptomatology      is highly worrisome for acute coronary syndrome. I am  concerned he      may have evidence of  in-stent restenosis or a new coronary lesion.      Certainly other etiologies for his symptoms include heart failure.      His physical exam findings are predominantly right sided heart      failure so certainly this could be from  pulmonary hypertension.  At      this point venous thromboembolism disease is in my differential but      should only be considered if he has a normal cardiac      catheterization.  Because of his history as well as his progressive  exertional symptoms, I think that he would  benefit from cardiac      catheterization.  I have discussed this with him and he is      agreeable to doing this. In the interim time we will medically      treat him.  He is on aspirin 325 mg daily.  He is not taking Plavix      at home and I will not load him with Plavix because he is high risk      for three-vessel coronary artery disease and possible need for open      heart surgery.  He is on a heparin drip.  We will continue this. We      will not use 2b/3a  inhibitor unless the patient rules in for      myocardial infarction.  We will start the patient on beta blocker,      ACE inhibitor and high-dose statin.  We will rule the patient out      for myocardial infarction with serial cardiac enzymes and check an      echocardiogram in the morning to document what his pulmonary      artery pressures are as well as his left ventricular function.   1. Diabetes mellitus.  We will check a hemoglobin A1c.  At this time I      will be holding all of his oral medications as the patient will be      N.P.O. and he will be undergoing cardiac catheterization.  I will      give him Lantus  30 units q.h.s. and place him on the insulin      protocol and dose  his insulin accordingly.   1. Anxiety and depression.  Continue Prozac.   1. Hyperlipidemia.  Check fasting profile.  Continue fish oil.  Start      high dose Lipitor.   1. Fluids, electrolytes and nutrition. Normal saline at 75 cc/hour  as      he will likely undergo cardiac catheterization in the  morning.      Electrolytes are stable.  N.P.O. after midnight.   1. DVT prophylaxis not indicated as the patient is on systemic heparin      for his possible acute  coronary syndrome.      Therisa Doyne, MD  Electronically Signed     SJT/MEDQ  D:  10/06/2008  T:  10/06/2008  Job:  [id number]

## 2010-10-16 NOTE — Cardiovascular Report (Signed)
[name], [name] NO.:  [id number]   MEDICAL RECORD NO.:  [id number]          PATIENT TYPE:  INP   LOCATION:  3743                         FACILITY:  [facility]   PHYSICIAN:  Verne Carrow, MDDATE OF BIRTH:  1940-07-11   DATE OF PROCEDURE:  10/07/2008  DATE OF DISCHARGE:                            CARDIAC CATHETERIZATION   PRIMARY CARDIOLOGIST:  Cassell Clement, MD   PROCEDURE PERFORMED:  1. Left heart catheterization  2. Selective coronary angiography.  3. Left ventricular angiogram.   OPERATOR:  Verne Carrow, MD   INDICATIONS:  Unstable angina in a 70 year old patient with a history of  diabetes mellitus and known coronary artery disease status post multiple  prior percutaneous procedures mainly in the left anterior descending  coronary artery and the diagonal branch.  The patient reportedly has  several bare-metal stents in his left anterior descending coronary  artery as well as several drug-eluting stents in the LAD and a diagonal  branch.  He presented to [facility] on July 2008 with an ST  elevation myocardial infarction and was found to have a complex ruptured  plaque in the proximal LAD at the takeoff of a large diagonal branch.  He was also found to have significant in-stent restenosis in the stented  segments of the mid and distal LAD.  Cutting balloon angioplasty was  performed at that time in the areas of in-stent restenosis.  A primary  balloon angioplasty only was performed at the site of the proximal LAD  ruptured plaque at the location at the takeoff of the diagonal branch.  The patient has done well over the last 2-1/2 years, but has over the  last several weeks been experiencing exertional angina.  He is admitted  to the hospital and is ruled out for myocardial infarction with serial  cardiac enzymes.  He had no recurrence of his chest pain while in the  hospital.  He is referred for a diagnostic left heart  catheterization  today.   PROCEDURE IN DETAIL:  The patient was brought to the inpatient cardiac  catheterization laboratory after signing informed consent for the  procedure.  The right groin was prepped and draped in a sterile fashion.  Lidocaine 1% was used for local anesthesia.  A 5-French sheath was  inserted into the right femoral artery without difficulty.  Standard  diagnostic catheters were used to perform selective coronary  angiography.  A pigtail catheter was used to perform a left ventricular  angiogram.  There was no pressure gradient across the aortic valve with  pullback of this catheter.  The patient tolerated the procedure well and  was taken to the holding area in stable condition.   ANGIOGRAPHIC FINDINGS:  1. The left main coronary artery had no significant obstructive      disease.  2. The left anterior descending is a large vessel that courses to the      apex and is found to have a 95% stenosis in the proximal portion of      the vessel just prior to the takeoff of a large diagonal branch.  The diagonal branch itself had an ostial 90% stenosis.  There are      several areas of prior stenting noted in the diagonal branch with      only mild restenosis in both of these stented segments.  The mid      LAD has a stented segment with moderate in-stent restenosis of 50%.      There is a stent in the distal LAD that has 60% in-stent      restenosis.  There is also a stent in the apical portion of the      distal LAD that has mild-to-moderate restenosis of 40%.  3. The circumflex artery has an early obtuse marginal branch that has      an ostial 50% stenosis.  The mid circumflex has a 60-70% stenosis.      The second obtuse marginal has a 50% plaque noted.  4. The right coronary artery is a dominant vessel that has a 30%      stenosis in the proximal portion, a discrete 40% stenosis in the      midportion, and plaque in the distal portion of the vessel.  5. Left  ventricular angiogram was performed in the RAO projection and      shows mild segmental LV dysfunction with overall ejection fraction      of 45-50%.  The apical and inferoapical segment has severe      hypokinesis.  There is no significant mitral regurgitation noted.   IMPRESSION:  1. Double-vessel coronary artery disease with a complex stenosis in      the proximal LAD involving the bifurcation of a large diagonal      branch.  2. Mild segmental left ventricular dysfunction.  3. Unstable angina with no current evidence of myocardial ischemia.   RECOMMENDATIONS:  The patient has significant stenosis at the site of  the prior plaque rupture in the proximal LAD at the bifurcation of the  large diagonal branch.  The most recent catheterization was performed in  a setting of an ST elevation MI in July 2008 at which time the location  of the proximal LAD was found to have a ruptured plaque.  Balloon  angioplasty was performed at that time and no stent was placed secondary  to potential risk of losing the large diagonal branch.  The patient has  exhibited over the last 9 years significant stenosis inside the stent  that had been placed.  He now presents with unstable angina and is found  to have a proximal left anterior descending coronary artery lesion that  involved the large diagonal branch.  I feel that if this were treated  percutaneously there would be a I risk of losing the large diagonal  branch.  The patient has also exhibited high prevalence towards stent  restenosis.  I would like to stop today and review the different options  that are available.  I will discuss the case with the patient's primary  cardiologist Dr. Patty Sermons.  I feel that a surgical consultation would  be appropriate to at least review the potential for surgical  revascularization procedure.  I feel that a left internal mammary artery  bypass graft to this patient's mid LAD will give him long-lasting  result.   We could also consider bypasses to the diagonal branch as well  as to the circumflex system.      Verne Carrow, MD  Electronically Signed     CM/MEDQ  D:  10/07/2008  T:  10/08/2008  Job:  [id number]   cc:   Cassell Clement, M.D.

## 2010-10-16 NOTE — Op Note (Signed)
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   SURGICAL PROCEDURE:  Coronary artery bypass grafting x2 with left  internal mammary to the distal left anterior descending coronary artery  and reverse saphenous vein graft to the diagonal coronary artery with  the right thigh endovein harvesting.   SURGEON:  Sheliah Plane, MD   FIRST ASSISTANT:  Doree Fudge, PA   BRIEF HISTORY:  The patient is a 70 year old male with severely poorly  controlled diabetes and obesity and LV dysfunction, who presented  several months ago with increasing congestive heart failure, he  underwent cardiac catheterization at that time.  He has had no more  stents placed in his LAD, which were patent, but now has severe high-  grade stenosis in the proximal LAD and diagonal, has luminal  irregularities in the circumflex and the right coronary artery, but no  significant stenosis.  At that time, coronary artery bypass grafting was  recommended, however, the patient refused.  He then re-presented to the  office with still poorly controlled diabetes and ongoing angina and  evidence of congestive heart failure.  He agreed to proceed with  coronary artery bypass grafting.  Risks and options were discussed in  detail on multiple occasions.   DESCRIPTION OF PROCEDURE:  With Swan-Ganz and arterial line monitors in  place, the patient underwent general endotracheal anesthesia without  incidence.  Skin of the  chest and legs was prepped with Betadine and  draped in usual sterile manner.  Using the Guidant endovein harvesting  system, segment of vein was harvested and right thigh was of adequate  quality and caliber.  Median sternotomy was performed and left internal  mammary artery was dissected down as a pedicle graft.  The distal artery  was divided and hydrostatically dilated with heparinized saline.  Pericardium was opened.  The patient's LAD had had multiple stents in it  and was partially intramyocardial, so no attempt during the case, off  pump was tried.  The patient was systemically heparinized, ascending  aorta was cannulated, and aortic root vent cardioplegia needle was  introduced into the ascending aorta, right atrium was also cannulated.  The patient was placed on cardiopulmonary bypass 2.4 L/min/m2.  The  first diagonal vessel was identified and opened and admitted a 1-mm  probe, thus the LAD was dissected out of its partial intramyocardial  position at the very distal end of the LAD and the distal third.  An  area of the vessel appeared adequate in size and with no palpable stent  in place was selected.  The patient's body temperature was cooled to 30  degrees.  Aortic cross-clamp was applied.  500 mL of cold blood  potassium cardioplegia was administered with diastolic arrest of the  heart.  Myocardial septal temperatures were monitored throughout the  cross-clamp.  Attention was turned first to the diagonal coronary  artery, which was opened, admitted a 1-mm probe.  Using a running 7-0  Prolene, distal anastomosis was performed.  Attention was then turned to  the distal left anterior descending coronary artery, which was opened.  Using a running 8-0 Prolene, left internal mammary artery was  anastomosed to left anterior descending coronary artery.  With release  of the bulldog on the mammary artery, there was rise in myocardial  septal temperature, the bulldog placed back on  the mammary artery.  Additional cold blood cardioplegia was administered down the vein graft  and into the root.  With the aortic cross-clamp in place, single punch  aortotomy was performed and the vein graft to the diagonal was  anastomosed to the ascending aorta.  Air was evacuated from the heart  and ascending aorta, and the aortic cross-clamp was removed with total  cross-clamp time 46 minutes.  The patient spontaneously converted to a  sinus rhythm.  Sites of anastomosis were inspected free of bleeding.  Atrial and ventricular pacing wires were applied.  Graft marker was  applied.  The patient was loaded with milrinone and low-dose dopamine.  He was weaned from cardiopulmonary bypass without difficulty, and  remained hemodynamically stable.  He was decannulated in usual fashion.  Protamine sulfate was administered.  With the operative field  hemostatic, the body of the left pleural tube and a Blake mediastinal  drain were left in place.  Pericardium was reapproximated.  Sternum  closed with #6 stainless steel wire.  Fascia closed with interrupted 0  Vicryl, running 3-0 Vicryl, subcutaneous tissue, 3-0 subcuticular  stitch, and skin edges.  Dry dressings were applied.  Sponge and needle  count was reported as correct at the completion of the procedure.  The  patient tolerated the procedure without obvious complication and was  transferred to Surgical Intensive Care Unit for further postoperative  care.  Total pump time was 72 minutes.  He did not require any blood  bank or blood products during the operative procedure.      Sheliah Plane, MD  Electronically Signed     EG/MEDQ  D:  12/07/2008  T:  12/08/2008  Job:  [id number]   cc:   Cassell Clement, M.D.

## 2011-03-19 LAB — DIFFERENTIAL
Basophils Absolute: 0
Basophils Relative: 0
Monocytes Absolute: 0.5
Neutro Abs: 9 — ABNORMAL HIGH
Neutrophils Relative %: 84 — ABNORMAL HIGH

## 2011-03-19 LAB — BASIC METABOLIC PANEL
BUN: 21
CO2: 29
Calcium: 8.6
Calcium: 8.6
Chloride: 104
Chloride: 107
Creatinine, Ser: 0.77
Creatinine, Ser: 0.83
Creatinine, Ser: 0.93
GFR calc Af Amer: >60
GFR calc Af Amer: >60
GFR calc non Af Amer: >60
GFR calc non Af Amer: >60
Glucose, Bld: 144 — ABNORMAL HIGH

## 2011-03-19 LAB — LIPID PANEL
LDL Cholesterol: 74
Triglycerides: 52

## 2011-03-19 LAB — POCT CARDIAC MARKERS
CKMB, poc: 1.4
Myoglobin, poc: 202
Operator id: 291361
Operator id: 291361
Troponin i, poc: 0.11 — ABNORMAL HIGH

## 2011-03-19 LAB — I-STAT 8, (EC8 V) (CONVERTED LAB)
Acid-Base Excess: 2
Chloride: 106
Glucose, Bld: 340 — ABNORMAL HIGH
Hemoglobin: 13.6
Potassium: 3.5
Sodium: 139
TCO2: 23

## 2011-03-19 LAB — HEMOGLOBIN A1C: Hgb A1c MFr Bld: 9.9 — ABNORMAL HIGH

## 2011-03-19 LAB — CARDIAC PANEL(CRET KIN+CKTOT+MB+TROPI)
CK, MB: 46.1 — ABNORMAL HIGH
Relative Index: 12.5 — ABNORMAL HIGH
Relative Index: 7.9 — ABNORMAL HIGH
Total CK: 1835 — ABNORMAL HIGH
Total CK: 581 — ABNORMAL HIGH
Troponin I: 26.61
Troponin I: 45.13
Troponin I: 73.7

## 2011-03-19 LAB — COMPREHENSIVE METABOLIC PANEL
ALT: 21
AST: 26
Albumin: 2.9 — ABNORMAL LOW
CO2: 24
CO2: 25
Calcium: 8.9
Chloride: 106
Creatinine, Ser: 0.91
GFR calc Af Amer: >60
GFR calc non Af Amer: >60
GFR calc non Af Amer: >60
Glucose, Bld: 362 — ABNORMAL HIGH
Potassium: 4.3
Sodium: 137
Total Bilirubin: 0.8

## 2011-03-19 LAB — PLATELET COUNT: Platelets: 207

## 2011-03-19 LAB — CBC
HCT: 35.8 — ABNORMAL LOW
HCT: 39.1
Hemoglobin: 13.2
MCHC: 33.8
MCHC: 34.1
MCV: 84.9
MCV: 85.6
RBC: 3.75 — ABNORMAL LOW
RBC: 4.18 — ABNORMAL LOW
RBC: 4.6
RDW: 13.4
WBC: 5.8

## 2011-03-19 LAB — PROTIME-INR: INR: 1

## 2011-03-19 LAB — CK TOTAL AND CKMB (NOT AT ARMC): Total CK: 264 — ABNORMAL HIGH

## 2011-03-19 LAB — TROPONIN I: Troponin I: 1.03

## 2011-03-19 LAB — D-DIMER, QUANTITATIVE: D-Dimer, Quant: 0.56 — ABNORMAL HIGH

## 2011-10-25 ENCOUNTER — Encounter: Payer: Self-pay | Admitting: *Deleted

## 2012-04-06 ENCOUNTER — Encounter: Payer: Self-pay | Admitting: Cardiology

## 2013-01-08 ENCOUNTER — Encounter (INDEPENDENT_AMBULATORY_CARE_PROVIDER_SITE_OTHER): Payer: Self-pay | Admitting: Ophthalmology

## 2013-01-20 ENCOUNTER — Encounter (INDEPENDENT_AMBULATORY_CARE_PROVIDER_SITE_OTHER): Payer: Self-pay | Admitting: Ophthalmology

## 2013-02-10 ENCOUNTER — Encounter (INDEPENDENT_AMBULATORY_CARE_PROVIDER_SITE_OTHER): Payer: Medicare Other | Admitting: Ophthalmology

## 2013-02-10 DIAGNOSIS — I10 Essential (primary) hypertension: Secondary | ICD-10-CM

## 2013-02-10 DIAGNOSIS — H3581 Retinal edema: Secondary | ICD-10-CM

## 2013-02-10 DIAGNOSIS — E11319 Type 2 diabetes mellitus with unspecified diabetic retinopathy without macular edema: Secondary | ICD-10-CM

## 2013-02-10 DIAGNOSIS — E1139 Type 2 diabetes mellitus with other diabetic ophthalmic complication: Secondary | ICD-10-CM

## 2013-02-10 DIAGNOSIS — H43819 Vitreous degeneration, unspecified eye: Secondary | ICD-10-CM

## 2013-02-10 DIAGNOSIS — H35039 Hypertensive retinopathy, unspecified eye: Secondary | ICD-10-CM

## 2013-02-11 ENCOUNTER — Encounter (INDEPENDENT_AMBULATORY_CARE_PROVIDER_SITE_OTHER): Payer: Self-pay | Admitting: Ophthalmology

## 2013-06-16 ENCOUNTER — Ambulatory Visit (INDEPENDENT_AMBULATORY_CARE_PROVIDER_SITE_OTHER): Payer: Medicare Other | Admitting: Ophthalmology

## 2013-10-08 ENCOUNTER — Non-Acute Institutional Stay (SKILLED_NURSING_FACILITY): Payer: Medicare Other | Admitting: Internal Medicine

## 2013-10-08 ENCOUNTER — Encounter: Payer: Self-pay | Admitting: Internal Medicine

## 2013-10-08 DIAGNOSIS — E119 Type 2 diabetes mellitus without complications: Secondary | ICD-10-CM | POA: Insufficient documentation

## 2013-10-08 DIAGNOSIS — E785 Hyperlipidemia, unspecified: Secondary | ICD-10-CM | POA: Insufficient documentation

## 2013-10-08 DIAGNOSIS — I10 Essential (primary) hypertension: Secondary | ICD-10-CM | POA: Insufficient documentation

## 2013-10-08 DIAGNOSIS — I5032 Chronic diastolic (congestive) heart failure: Secondary | ICD-10-CM | POA: Insufficient documentation

## 2013-10-08 DIAGNOSIS — E669 Obesity, unspecified: Secondary | ICD-10-CM | POA: Insufficient documentation

## 2013-10-08 DIAGNOSIS — IMO0002 Reserved for concepts with insufficient information to code with codable children: Secondary | ICD-10-CM | POA: Insufficient documentation

## 2013-10-08 DIAGNOSIS — I2510 Atherosclerotic heart disease of native coronary artery without angina pectoris: Secondary | ICD-10-CM | POA: Insufficient documentation

## 2013-10-08 DIAGNOSIS — I69959 Hemiplegia and hemiparesis following unspecified cerebrovascular disease affecting unspecified side: Secondary | ICD-10-CM

## 2013-10-08 NOTE — Assessment & Plan Note (Addendum)
Continue zocor 40mg daily  

## 2013-10-08 NOTE — Assessment & Plan Note (Signed)
On Lopressor 25 mg and Lisinopril 5 mg daily. BP is mild elevated today but will watch for trend. Pt has been here only 24 hours and no old records yet.

## 2013-10-08 NOTE — Assessment & Plan Note (Signed)
Treated with metformin, actos, insulin;on ACE and statin

## 2013-10-08 NOTE — Assessment & Plan Note (Signed)
Pt in ASA 325 as prophylaxis;admitted for OT/Pt from home

## 2013-10-08 NOTE — Assessment & Plan Note (Signed)
Pt on ASA, statin and SL NTG prn

## 2013-10-08 NOTE — Assessment & Plan Note (Signed)
Proper diabetic diet

## 2013-10-08 NOTE — Progress Notes (Signed)
MRN: [id number] Name: [name]  Sex: male Age: 73 y.o. DOB: 01/10/1941  [facility] Facility/Room: 204 Level Of Care: SNF Provider: Margit HanksAnne D Elverna Caffee Emergency Contacts: Extended Emergency Contact Information Primary Emergency Contact: Parisien,ROSEMARY Address: [address]          [place],  [address] Home Phone: [phone] Relation: None  Code Status: DNR  Allergies: Review of patient's allergies indicates no known allergies.  Chief Complaint  Patient presents with  . nursing home admission    HPI: Patient is 73 y.o. male who is s/p CVA with l side weakness who is admitted to SNF for OT/PT.  Past Medical History  Diagnosis Date  . Coronary artery disease   . Diastolic dysfunction   . Ischemic heart disease   . Obesity   . Diabetes mellitus   . Hyperlipidemia   . HTN (hypertension)     Past Surgical History  Procedure Laterality Date  . Coronary artery bypass graft    . Cholecystectomy        Medication List       This list is accurate as of: 10/08/13  2:58 PM.  Always use your most recent med list.               acetaminophen 500 MG tablet  Commonly known as:  TYLENOL  Take 500 mg by mouth 3 (three) times daily as needed.     aspirin 325 MG tablet  Take 325 mg by mouth daily.     cyanocobalamin 1000 MCG/ML injection  Commonly known as:  (VITAMIN B-12)  Inject 1,000 mcg into the muscle every 30 (thirty) days.     ferrous sulfate 325 (65 FE) MG tablet  Take 325 mg by mouth 2 (two) times daily with a meal.     Fish Oil 1000 MG Caps  Take 1 capsule by mouth 2 (two) times daily.     FLUoxetine 40 MG capsule  Commonly known as:  PROZAC  Take 40 mg by mouth daily.     insulin glargine 100 UNIT/ML injection  Commonly known as:  LANTUS  Inject 30 Units into the skin 2 (two) times daily.     lisinopril 5 MG tablet  Commonly known as:  PRINIVIL,ZESTRIL  Take 5 mg by mouth daily.     metFORMIN 850 MG tablet  Commonly known as:   GLUCOPHAGE  Take 850 mg by mouth 2 (two) times daily with a meal.     metoprolol tartrate 25 MG tablet  Commonly known as:  LOPRESSOR  Take 25 mg by mouth daily.     nitroGLYCERIN 0.4 MG SL tablet  Commonly known as:  NITROSTAT  Place 0.4 mg under the tongue every 5 (five) minutes as needed for chest pain.     omeprazole 20 MG capsule  Commonly known as:  PRILOSEC  Take 20 mg by mouth daily.     pioglitazone 15 MG tablet  Commonly known as:  ACTOS  Take 15 mg by mouth daily. For a week then 2 po daily     potassium chloride SA 20 MEQ tablet  Commonly known as:  K-DUR,KLOR-CON  Take 20 mEq by mouth daily.     simvastatin 40 MG tablet  Commonly known as:  ZOCOR  Take 40 mg by mouth daily.     TAB-A-VITE/MINERALS PO  Take 1 tablet by mouth daily.        Meds ordered this encounter  Medications  . pioglitazone (ACTOS) 15  MG tablet    Sig: Take 15 mg by mouth daily. For a week then 2 po daily  . omeprazole (PRILOSEC) 20 MG capsule    Sig: Take 20 mg by mouth daily.  . nitroGLYCERIN (NITROSTAT) 0.4 MG SL tablet    Sig: Place 0.4 mg under the tongue every 5 (five) minutes as needed for chest pain.  . Multiple Vitamins-Minerals (TAB-A-VITE/MINERALS PO)    Sig: Take 1 tablet by mouth daily.  . metFORMIN (GLUCOPHAGE) 850 MG tablet    Sig: Take 850 mg by mouth 2 (two) times daily with a meal.  . FLUoxetine (PROZAC) 40 MG capsule    Sig: Take 40 mg by mouth daily.  . Omega-3 Fatty Acids (FISH OIL) 1000 MG CAPS    Sig: Take 1 capsule by mouth 2 (two) times daily.  Marland Kitchen acetaminophen (TYLENOL) 500 MG tablet    Sig: Take 500 mg by mouth 3 (three) times daily as needed.  . potassium chloride SA (K-DUR,KLOR-CON) 20 MEQ tablet    Sig: Take 20 mEq by mouth daily.  . simvastatin (ZOCOR) 40 MG tablet    Sig: Take 40 mg by mouth daily.  Marland Kitchen aspirin 325 MG tablet    Sig: Take 325 mg by mouth daily.  . cyanocobalamin (,VITAMIN B-12,) 1000 MCG/ML injection    Sig: Inject 1,000 mcg into  the muscle every 30 (thirty) days.  . ferrous sulfate 325 (65 FE) MG tablet    Sig: Take 325 mg by mouth 2 (two) times daily with a meal.  . lisinopril (PRINIVIL,ZESTRIL) 5 MG tablet    Sig: Take 5 mg by mouth daily.  . metoprolol tartrate (LOPRESSOR) 25 MG tablet    Sig: Take 25 mg by mouth daily.     There is no immunization history on file for this patient.  History  Substance Use Topics  . Smoking status: Former Games developer  . Smokeless tobacco: Not on file  . Alcohol Use: Not on file    Family history is noncontributory    Review of Systems   UTO from pt;he was non verbal;did make eye contact   Filed Vitals:   10/08/13 1359  BP: 157/73  Pulse: 76  Temp: 97.9 F (36.6 C)  Resp: 18    Physical Exam  GENERAL APPEARANCE: Alert, nonconversant. Appropriately groomed. No acute distress; mkes eye contac SKIN: No diaphoresis rash HEAD: Normocephalic, atraumatic  EYES: Conjunctiva/lids clear. Pupils round, reactive. EOMs intact.  EARS: External exam WNL, canals clear. Hearing grossly normal.  NOSE: No deformity or discharge.  MOUTH/THROAT: Lips w/o lesions.   RESPIRATORY: Breathing is even, unlabored. Lung sounds are clear   CARDIOVASCULAR: Heart RRR no murmurs, rubs or gallops. No peripheral edema.   GASTROINTESTINAL: Abdomen is soft, non-tender, not distended w/ normal bowel sounds GENITOURINARY: Bladder non tender, not distended  MUSCULOSKELETAL: No abnormal joints or musculature NEUROLOGIC: no facial weakness noted;+Ll side weakness PSYCHIATRIC: , no behavioral issues  Patient Active Problem List   Diagnosis Date Noted  . 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 Type 2 diabetes mellitus with HbA1C goal to be determined   . Hyperlipidemia   . HTN (hypertension)   . Coronary artery disease   . Obesity   . Chronic congestive heart failure with left ventricular diastolic dysfunction        Assessment and Plan  Chronic  congestive heart failure with left ventricular diastolic dysfunction Pt on BBlocker, ACE, no lasix.  Hemiplegia of nondominant side following  CVA (cerebrovascular accident) Pt in ASA 325 as prophylaxis;admitted for OT/Pt from home  Coronary artery disease Pt on ASA, statin and SL NTG prn  HTN (hypertension) On Lopressor 25 mg and Lisinopril 5 mg daily. BP is mild elevated today but will watch for trend. Pt has been here only 24 hours and no old records yet.  Type 2 diabetes mellitus with HbA1C goal to be determined Treated with metformin, actos, insulin;on ACE and statin  Hyperlipidemia Continue zocor 40 mg daily   Obesity Proper diabetic diet    Margit HanksAnne D Nirvan Laban, MD

## 2013-10-08 NOTE — Assessment & Plan Note (Signed)
Pt on BBlocker, ACE, no lasix.

## 2013-10-11 ENCOUNTER — Non-Acute Institutional Stay (SKILLED_NURSING_FACILITY): Payer: Medicare Other | Admitting: Internal Medicine

## 2013-10-11 DIAGNOSIS — IMO0002 Reserved for concepts with insufficient information to code with codable children: Secondary | ICD-10-CM

## 2013-10-11 DIAGNOSIS — R51 Headache: Secondary | ICD-10-CM

## 2013-10-11 DIAGNOSIS — I69959 Hemiplegia and hemiparesis following unspecified cerebrovascular disease affecting unspecified side: Secondary | ICD-10-CM

## 2013-10-12 NOTE — Progress Notes (Addendum)
Patient ID: [name], male   DOB: 04/29/1941, 73 y.o.   MRN: [id number]                  PROGRESS NOTE  DATE:  10/11/2013    [facility]    LEVEL OF CARE:   SNF   Acute Visit   CHIEF COMPLAINT:  Headache.    HISTORY OF PRESENT ILLNESS:  This is an unfortunate, 73 year-old man who [facility].  He had presented with an extensive right middle cerebral artery stroke.  He has been left with a dense hemiparesis, left neglect, and there has been some concern raised about a constant complaint of headache.  I have been asked to see him.    Reviewing his records revealed that carotid artery dopplers did show a 40-59% stenosis on the left; however, no significant stenosis on the right.     In discussing things with the patient, he describes a bifrontal, rubs across both sides, across his forehead.  He seems to indicate that this is fairly constant rather than being intermittent.  He does not describe this as pulsatile.  He does not describe visual loss.  However, it is not easy to communicate with him.      PHYSICAL EXAMINATION:   VITAL SIGNS:   TEMPERATURE:  99.1.   PULSE:   78.   RESPIRATIONS:  20.   BLOOD PRESSURE:  170/77.   O2 SATURATIONS:  97% on room air.   CHEST/RESPIRATORY:  Clear air entry bilaterally.   CARDIOVASCULAR:  CARDIAC:   Heart sounds are normal.  There are no murmurs.   GASTROINTESTINAL:  LIVER/SPLEEN/KIDNEYS:  No liver, no spleen.  No tenderness.    NEUROLOGICAL:     CRANIAL NERVES:   He appears to have left hemineglect.  Whether he has a visual field loss, I am uncertain.   SENSATION/STRENGTH:  He has dense left hemiparesis.  This does not appear to be new.    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  It is not unheard of for patients to describe a constant headache after this type of large hemispheric stroke.  He does not appear to have  a history of migraines.  At this point, I am going to put him on regular Tylenol Extra-Strength.  I will attempt to see this again next week.  At this point, I do not think there needs to be any additional neuroimaging.  I am not unduly concerned about this at this point.

## 2013-10-13 ENCOUNTER — Encounter: Payer: Self-pay | Admitting: Internal Medicine

## 2013-10-13 ENCOUNTER — Non-Acute Institutional Stay (SKILLED_NURSING_FACILITY): Payer: Medicare Other | Admitting: Internal Medicine

## 2013-10-13 DIAGNOSIS — W19XXXA Unspecified fall, initial encounter: Secondary | ICD-10-CM

## 2013-10-13 DIAGNOSIS — I69959 Hemiplegia and hemiparesis following unspecified cerebrovascular disease affecting unspecified side: Secondary | ICD-10-CM

## 2013-10-13 DIAGNOSIS — Y92129 Unspecified place in nursing home as the place of occurrence of the external cause: Principal | ICD-10-CM

## 2013-10-13 DIAGNOSIS — IMO0002 Reserved for concepts with insufficient information to code with codable children: Secondary | ICD-10-CM

## 2013-10-13 DIAGNOSIS — S0990XA Unspecified injury of head, initial encounter: Secondary | ICD-10-CM

## 2013-10-14 ENCOUNTER — Encounter: Payer: Self-pay | Admitting: Internal Medicine

## 2013-10-14 DIAGNOSIS — R519 Headache, unspecified: Secondary | ICD-10-CM | POA: Insufficient documentation

## 2013-10-14 DIAGNOSIS — R51 Headache: Secondary | ICD-10-CM | POA: Insufficient documentation

## 2013-10-14 NOTE — Assessment & Plan Note (Signed)
Fall risk

## 2013-10-14 NOTE — Progress Notes (Signed)
MRN: [id number] Name: [name]  Sex: male Age: 73 y.o. DOB: 08/10/1940  [place] #: [facility] Facility/Room: 204 Level Of Care: SNF Provider: Hennie Duos Emergency Contacts: Extended Emergency Contact Information Primary Emergency Contact: Preiss,ROSEMARY Address: [address]          [place],  [address] Home Phone: [phone] Relation: None  Code Status: DNR  Allergies: Review of patient's allergies indicates no known allergies.  Chief Complaint  Patient presents with  . Acute Visit    HPI: Patient is 73 y.o. male who is seen when called by nurses because pt is lying on the floor face down.  Past Medical History  Diagnosis Date  . Coronary artery disease   . Diastolic dysfunction   . Ischemic heart disease   . Obesity   . Diabetes mellitus   . Hyperlipidemia   . HTN (hypertension)     Past Surgical History  Procedure Laterality Date  . Coronary artery bypass graft    . Cholecystectomy        Medication List       This list is accurate as of: 10/13/13 11:59 PM.  Always use your most recent med list.               acetaminophen 500 MG tablet  Commonly known as:  TYLENOL  Take 500 mg by mouth 3 (three) times daily as needed.     aspirin 325 MG tablet  Take 325 mg by mouth daily.     cyanocobalamin 1000 MCG/ML injection  Commonly known as:  (VITAMIN B-12)  Inject 1,000 mcg into the muscle every 30 (thirty) days.     ferrous sulfate 325 (65 FE) MG tablet  Take 325 mg by mouth 2 (two) times daily with a meal.     Fish Oil 1000 MG Caps  Take 1 capsule by mouth 2 (two) times daily.     FLUoxetine 40 MG capsule  Commonly known as:  PROZAC  Take 40 mg by mouth daily.     insulin glargine 100 UNIT/ML injection  Commonly known as:  LANTUS  Inject 30 Units into the skin 2 (two) times daily.     lisinopril 5 MG tablet  Commonly known as:  PRINIVIL,ZESTRIL  Take 5 mg by mouth daily.     metFORMIN 850 MG tablet  Commonly known as:   GLUCOPHAGE  Take 850 mg by mouth 2 (two) times daily with a meal.     metoprolol tartrate 25 MG tablet  Commonly known as:  LOPRESSOR  Take 25 mg by mouth daily.     nitroGLYCERIN 0.4 MG SL tablet  Commonly known as:  NITROSTAT  Place 0.4 mg under the tongue every 5 (five) minutes as needed for chest pain.     omeprazole 20 MG capsule  Commonly known as:  PRILOSEC  Take 20 mg by mouth daily.     pioglitazone 15 MG tablet  Commonly known as:  ACTOS  Take 15 mg by mouth daily. For a week then 2 po daily     potassium chloride SA 20 MEQ tablet  Commonly known as:  K-DUR,KLOR-CON  Take 20 mEq by mouth daily.     simvastatin 40 MG tablet  Commonly known as:  ZOCOR  Take 40 mg by mouth daily.     TAB-A-VITE/MINERALS PO  Take 1 tablet by mouth daily.        No orders of the defined types were placed in this encounter.  There is no immunization history on file for this patient.  History  Substance Use Topics  . Smoking status: Former Research scientist (life sciences)  . Smokeless tobacco: Not on file  . Alcohol Use: Not on file    Review of Systems - limited 2/2 pt situation; pt has no memory as how he came to be on floor  DATA OBTAINED: from patient NECK: admits hurts on side of neck RESPIRATORY: No cough, wheezing, SOB CARDIAC: No chest pain  GI: No abdominal pain MUSCULOSKELETAL: No  bone/joint pain NEUROLOGIC: head hurts.   Filed Vitals:   10/13/13 1635  BP: 180/84  Pulse: 98  Temp: 99 F (37.2 C)  Resp: 18    Physical Exam    All bones and body areas palpated by me before he was turned with neck protection; neck palpated withour tenderness or crepitance  GENERAL APPEARANCE: Alert, conversant.  SKIN: minor abrasion L shin, minor abrasion L forehead HEENT: mild swelling L forehead NECK: no bony TTP; ST tenderness on R RESPIRATORY: Breathing is even, unlabored. Lung sounds are clear   CARDIOVASCULAR: Heart RRR no murmurs, rubs or gallops. No peripheral edema   GASTROINTESTINAL: Abdomen is soft, non-tender, not distended w/ normal bowel sounds.  GENITOURINARY: Bladder non tender, not distended  MUSCULOSKELETAL: No abnormal joints or musculature NEUROLOGIC: hemiparesis, unchanged   Patient Active Problem List   Diagnosis Date Noted  . Headache(784.0) 10/14/2013  . 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 Type 2 diabetes mellitus with HbA1C goal to be determined   . Hyperlipidemia   . HTN (hypertension)   . Coronary artery disease   . Obesity   . Chronic congestive heart failure with left ventricular diastolic dysfunction     CBC    Component Value Date/Time   WBC 8.4 12/12/2008 0400   RBC 3.59* 12/12/2008 0400   HGB 10.6* 12/12/2008 0400   HCT 30.6* 12/12/2008 0400   PLT 234 DELTA CHECK NOTED REPEATED TO VERIFY 12/12/2008 0400   MCV 85.4 12/12/2008 0400   LYMPHSABS 1.3 10/06/2008 1613   MONOABS 0.8 10/06/2008 1613   EOSABS 0.1 10/06/2008 1613   BASOSABS 0.1 10/06/2008 1613    CMP     Component Value Date/Time   NA 138 12/12/2008 0400   K 3.1* 12/12/2008 0400   CL 102 12/12/2008 0400   CO2 29 12/12/2008 0400   GLUCOSE 48* 12/12/2008 0400   BUN 21 12/12/2008 0400   CREATININE 1.05 12/12/2008 0400   CALCIUM 8.4 12/12/2008 0400   PROT 6.9 12/01/2008 1329   ALBUMIN 4.2 12/01/2008 1329   AST 19 12/01/2008 1329   ALT 20 12/01/2008 1329   ALKPHOS 55 12/01/2008 1329   BILITOT 0.8 12/01/2008 1329   GFRNONAA >60 12/12/2008 0400   GFRAA  Value: >60        The eGFR has been calculated using the MDRD equation. This calculation has not been validated in all clinical situations. eGFR's persistently <60 mL/min signify possible Chronic Kidney Disease. 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

## 2013-12-16 ENCOUNTER — Encounter: Payer: Self-pay | Admitting: Internal Medicine

## 2013-12-16 ENCOUNTER — Non-Acute Institutional Stay (SKILLED_NURSING_FACILITY): Payer: Medicare Other | Admitting: Internal Medicine

## 2013-12-16 DIAGNOSIS — IMO0002 Reserved for concepts with insufficient information to code with codable children: Secondary | ICD-10-CM

## 2013-12-16 DIAGNOSIS — I69959 Hemiplegia and hemiparesis following unspecified cerebrovascular disease affecting unspecified side: Secondary | ICD-10-CM

## 2013-12-16 DIAGNOSIS — R1319 Other dysphagia: Secondary | ICD-10-CM

## 2013-12-16 NOTE — Progress Notes (Signed)
Patient ID: [name], male   DOB: 10/06/1940, 73 y.o.   MRN: [id number]   Addendum-I have reviewed his labs.  On 12/07/2013.  Sodium was 137 potassium 4.1 BUN 42 creatinine 1.65.  10/12/2013.  WBC 10.4 hemoglobin 11.5 platelets 366.

## 2013-12-16 NOTE — Progress Notes (Signed)
Patient ID: [name], male   DOB: 03/17/1941, 73 y.o.   MRN: [id number]   this is an acute visit.  Level of care skilled.  Facility a half.   Chief Complaint   Patient presents with   .   acute visit secondary to inability to swallow --progressive dysphasia   HPI: Patient is 73 y.o. male who is s/p CVA with left  side weakness who is admitted to SNF for OT/PT--per nursing he's been relatively stable during his stay here-however over the past few days apparently has had some progressive dysphasia.  His speech therapist states that today he is really having difficulty swallowing-apparently this has progressed again over the last few days-speaking with patient he states he feels something catching it appears in the upper esophageal region -- he's had increased burping and belching the past 24 hours as well.  He is not complaining of any chest pain or shortness of breath.   His vital signs are stable  In addition to the speech therapist I also spoke with his nurse who states the same thing this has progressed now where he could not really swallow much of anything today.                                      Past Surgical History   Procedure  Laterality  Date   .  Coronary artery bypass graft     .  Cholecystectomy        Medication List                    acetaminophen 500 MG tablet    Commonly known as: TYLENOL    Take 500 mg by mouth 3 (three) times daily as needed.    aspirin 325 MG tablet    Take 325 mg by mouth daily.    cyanocobalamin 1000 MCG/ML injection    Commonly known as: (VITAMIN B-12)    Inject 1,000 mcg into the muscle every 30 (thirty) days.    ferrous sulfate 325 (65 FE) MG tablet    Take 325 mg by mouth 2 (two) times daily with a meal.    Fish Oil 1000 MG Caps    Take 1 capsule by mouth 2 (two) times daily.    FLUoxetine 40 MG capsule    Commonly known as: PROZAC    Take 40 mg by mouth daily.    insulin glargine 100 UNIT/ML injection    Commonly known as: LANTUS    Inject 30 Units into the skin 2 (two) times daily.    lisinopril 5 MG tablet    Commonly known as: PRINIVIL,ZESTRIL    Take 5 mg by mouth daily.    metFORMIN 850 MG tablet    Commonly known as: GLUCOPHAGE    Take 850 mg by mouth 2 (two) times daily with a meal.    metoprolol tartrate 25 MG tablet    Commonly known as: LOPRESSOR    Take 25 mg by mouth daily.    nitroGLYCERIN 0.4 MG SL tablet    Commonly known as: NITROSTAT    Place 0.4 mg under the tongue every 5 (five) minutes as needed for chest pain.    omeprazole 20 MG capsule    Commonly known as: PRILOSEC    Take 20 mg by mouth daily.    pioglitazone 30 MG tablet    Commonly known as:  ACTOS    Take 15 mg by mouth daily. For a week then 2 po daily    potassium chloride SA 20 MEQ tablet    Commonly known as: K-DUR,KLOR-CON    Take 20 mEq by mouth daily.    simvastatin 40 MG tablet    Commonly known as: ZOCOR    Take 40 mg by mouth daily.    TAB-A-VITE/MINERALS PO    Take 1 tablet by mouth daily.       ent.  History   Substance Use Topics   .  Smoking status:  Former Games developer   .  Smokeless tobacco:  Not on file   .  Alcohol Use:  Not on file   Family history is noncontributory  Review of Systems--patient is relatively nonverbal but did indicate he was not having  Any pain other than difficulty swallowing it appears more upper throat upper esophageal area is where he thinks  food   is sticking t                    Physical Exam Temperature 97.6 pulse 78 respirations 19 blood pressure 117/76 --- CBG is 100 GENERAL APPEARANCE: Alert, nonconversant. Appropriately groomed. No acute distress; mkes eye contac  SKIN: No diaphoresis rash  HEAD: Normocephalic, atraumatic  EYES: Conjunctiva/lids clear. Pupils round, reactive. EOMs intact.    MOUTH/THROAT: Lips w/o lesions--able to move his tongue and stick it out I did not really note any significant discharge in the throat area mucous membranes  appear moist.--Throat is clear  RESPIRATORY: Breathing is even, unlabored. Breath  sounds are clear  CARDIOVASCULAR: Heart RRR no murmurs, rubs or gallops. No peripheral edema.  GASTROINTESTINAL: Abdomen is soft, non-tender, not distended w/ normal bowel sounds  MUSCULOSKELETAL: No abnormal joints or musculature  NEUROLOGIC: no facial weakness noted;+L side weakness-- this appears to be baseline with previous exams neurologically PSYCHIATRIC: , Apparently quite stable although occasionally will have yelling  episodes Patient Active Problem List    Diagnosis  Date Noted   .  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  Type 2 diabetes mellitus   .  Hyperlipidemia    .  HTN (hypertension)    .  Coronary artery disease    .  Obesity    .  Chronic congestive heart failure with left ventricular diastolic dysfunction    Assessment and Plan  Dysphasia-this appears to be quite progressive over the last few days now is having difficulty swallowing-with  Minimal ability to take oral nourishment-will need  expedient evaluation-this is particularly important with his history of diabetes--on numerous agents Chronic congestive heart failure with left ventricular diastolic dysfunction  Pt on BBlocker, ACE, no lasix--this appears to be relatively stable.  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   Type 2 diabetes mellitus with HbA1C goal to be determined  Treated with metformin, actos, insulin;on ACE and statin --again this is complicated with his history of dysphagia and poor by mouth intake which certainly would put him at risk for hypoglycemia-CBG is 100 Hyperlipidemia  Continue zocor  40 mg daily  Obesity  Proper diabetic diet--again is not able to tolerate this with his swallowing difficulties  \  Of note we'll send patient to the ER for expedient evaluation.  [id number] note greater than 35 minutes spent assessing patient-reassessing patient to ensure stability-as well as reviewing his records and discussion with numerous staff members including nursing and speech therapist

## 2014-01-01 DEATH — deceased
